# Patient Record
Sex: Male | Born: 1970 | Race: Black or African American | Hispanic: No | Marital: Single | Smoking: Light tobacco smoker
Health system: Southern US, Community
[De-identification: ages and names within clinical notes are randomized; demographics above are authoritative.]

## PROBLEM LIST (undated history)

## (undated) DIAGNOSIS — I82409 Acute embolism and thrombosis of unspecified deep veins of unspecified lower extremity: Secondary | ICD-10-CM

## (undated) DIAGNOSIS — Z5900 Homelessness unspecified: Secondary | ICD-10-CM

## (undated) DIAGNOSIS — F32A Depression, unspecified: Secondary | ICD-10-CM

## (undated) DIAGNOSIS — I2699 Other pulmonary embolism without acute cor pulmonale: Secondary | ICD-10-CM

## (undated) DIAGNOSIS — F319 Bipolar disorder, unspecified: Secondary | ICD-10-CM

## (undated) DIAGNOSIS — F329 Major depressive disorder, single episode, unspecified: Secondary | ICD-10-CM

## (undated) DIAGNOSIS — I803 Phlebitis and thrombophlebitis of lower extremities, unspecified: Secondary | ICD-10-CM

## (undated) HISTORY — PX: OTHER SURGICAL HISTORY: SHX169

## (undated) MED ORDER — WARFARIN 7.5 MG TAB
7.5 mg | ORAL_TABLET | Freq: Every evening | ORAL | Status: DC
Start: ? — End: 2012-09-12

## (undated) MED ORDER — WARFARIN 7.5 MG TAB
7.5 mg | ORAL_TABLET | Freq: Every day | ORAL | Status: DC
Start: ? — End: 2017-08-17

## (undated) MED ORDER — OMEPRAZOLE 20 MG TAB, DELAYED RELEASE
20 mg | ORAL_TABLET | Freq: Every day | ORAL | Status: AC
Start: ? — End: 2013-01-26

## (undated) MED ORDER — HYDROXYZINE 25 MG TAB
25 mg | ORAL_TABLET | Freq: Three times a day (TID) | ORAL | Status: AC | PRN
Start: ? — End: 2012-09-18

## (undated) MED ORDER — WARFARIN 7.5 MG TAB
7.5 mg | ORAL_TABLET | Freq: Every day | ORAL | Status: DC
Start: ? — End: 2012-09-12

## (undated) MED ORDER — KETOROLAC TROMETHAMINE 10 MG TAB
10 mg | ORAL_TABLET | Freq: Four times a day (QID) | ORAL | Status: DC | PRN
Start: ? — End: 2012-09-12

## (undated) MED ORDER — NAPROXEN SODIUM 550 MG TAB
550 mg | ORAL_TABLET | Freq: Three times a day (TID) | ORAL | Status: DC
Start: ? — End: 2012-08-03

## (undated) MED ORDER — CITALOPRAM 10 MG TAB
10 mg | ORAL_TABLET | Freq: Every day | ORAL | Status: DC
Start: ? — End: 2017-08-17

## (undated) MED ORDER — CITALOPRAM 10 MG TAB
10 mg | ORAL_TABLET | Freq: Every day | ORAL | Status: DC
Start: ? — End: 2012-09-12

## (undated) MED ORDER — CEPHALEXIN 500 MG CAP
500 mg | ORAL_CAPSULE | Freq: Two times a day (BID) | ORAL | Status: AC
Start: ? — End: 2013-01-03

---

## 2011-12-21 MED ORDER — IBUPROFEN 800 MG TAB
800 mg | ORAL_TABLET | Freq: Four times a day (QID) | ORAL | Status: AC | PRN
Start: 2011-12-21 — End: 2011-12-28

## 2011-12-21 NOTE — ED Notes (Signed)
Pt has swelling and tingling to right hand. 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

## 2011-12-21 NOTE — ED Notes (Signed)
I have reviewed discharge instructions with the patient. Prescription was given.  The patient verbalized understanding. Pt discharged via ambulatory . Pt in no distress at time of discharge.

## 2011-12-21 NOTE — ED Provider Notes (Signed)
HPI Comments: Howard Harris is a 41 yo male who c/o R hand pain that has been ongoing for 2 weeks. Denies any known injury. Has had numbness/tingling. 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 Has swelling to R hand. Denies other symptoms. Howard Harris in NAD in room.      pmhx - none  shx - none    Patient is a 42 y.o. male presenting with hand pain. The history is provided by the patient.   Hand Pain   This is a new problem. The current episode started more than 1 week ago. The problem occurs constantly. The problem has been gradually worsening. The pain is present in the right hand. The pain is at a severity of 10/10. Associated symptoms include numbness, limited range of motion and tingling. Pertinent negatives include no stiffness.        History reviewed. No pertinent past medical history.     History reviewed. No pertinent past surgical history.      History reviewed. No pertinent family history.     History     Social History   ??? Marital Status: MARRIED     Spouse Name: N/A     Number of Children: N/A   ??? Years of Education: N/A     Occupational History   ??? Not on file.     Social History Main Topics   ??? Smoking status: Not on file   ??? Smokeless tobacco: Not on file   ??? Alcohol Use: Not on file   ??? Drug Use: Not on file   ??? Sexually Active: Not on file     Other Topics Concern   ??? Not on file     Social History Narrative   ??? No narrative on file                  ALLERGIES: Review of patient's allergies indicates no known allergies.      Review of Systems   Constitutional: Negative.  Negative for fever, diaphoresis and unexpected weight change.   Respiratory: Negative for cough and shortness of breath.    Cardiovascular: Negative for chest pain and palpitations.   Musculoskeletal: Positive for joint swelling and arthralgias. Negative for stiffness.   Skin: Negative for color change and pallor.   Neurological: Positive for tingling and numbness.    Psychiatric/Behavioral: Negative.  Negative for behavioral problems, confusion, self-injury and agitation.   All other systems reviewed and are negative.        Filed Vitals:    12/21/11 1246   BP: 110/90   Pulse: 70   Temp: 98.6 ??F (37 ??C)   Resp: 14   Height: 6\' 1"  (1.854 m)   Weight: 79.379 kg (175 lb)   SpO2: 98%            Physical Exam   Nursing note and vitals reviewed.  Constitutional: He is oriented to person, place, and time. He appears well-developed and well-nourished.  Non-toxic appearance. He does not have a sickly appearance. He does not appear ill. No distress.   HENT:   Head: Normocephalic and atraumatic.   Eyes: Conjunctivae and EOM are normal. Pupils are equal, round, and reactive to light.   Neck: Normal range of motion. Neck supple.   Cardiovascular: Normal rate, regular rhythm and normal heart sounds.  PMI is not displaced.  Exam reveals no gallop.    No murmur heard.  Pulmonary/Chest: Effort normal and breath sounds normal. No respiratory distress. He has no decreased breath sounds. He has no wheezes. He has no rhonchi. He has no rales.   Abdominal: Soft. There is no tenderness.   Musculoskeletal: He exhibits no edema and no tenderness.        Right hand: He exhibits tenderness and swelling. He exhibits normal range of motion, normal two-point discrimination, normal capillary refill, no deformity and no laceration. normal sensation noted. Normal strength noted.        Hands:  Neurological: He is alert and oriented to person, place, and time.   Skin: Skin is warm and dry. He is not diaphoretic.   Psychiatric: He has a normal mood and affect. His behavior is normal. Judgment and thought content normal.        MDM    Procedures    Assessment: right hand pain, swelling. Carpal tunnel.    Plan: fu with pcp. RICE. NSAIDs. Return if s/sxs worsen or change.    I have discussed the results of labs, procedures, radiographs, treatments as well as any previous results found within the [facility]. [facility] with the patient and available family.?? A treatment plan was developed in conjunction with the patient and was agreed upon. The patient is ready for discharge at this time.?? All voiced understanding of the discharge plan and medication instructions or changes as appropriate.?? Questions about treatment in the ED were answered.?? The patient was encouraged to return should symptoms worsen or new problems develop. A follow up physician was provided to the patient on the discharge papers.

## 2011-12-21 NOTE — ED Notes (Signed)
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

## 2011-12-21 NOTE — ED Provider Notes (Signed)
I was personally available for consultation in the emergency department.  I have reviewed the chart and agree with the documentation recorded by the MLP, including the assessment, treatment plan, and disposition.  Cystal Shannahan J. Samuel Rittenhouse, DO

## 2012-05-11 LAB — METABOLIC PANEL, COMPREHENSIVE
A-G Ratio: 0.9 — ABNORMAL LOW (ref 1.2–3.5)
ALT (SGPT): 28 U/L (ref 12–65)
AST (SGOT): 21 U/L (ref 15–37)
Albumin: 3.2 g/dL — ABNORMAL LOW (ref 3.5–5.0)
Alk. phosphatase: 77 U/L (ref 50–136)
Anion gap: 7 mmol/L (ref 7–16)
BUN: 12 MG/DL (ref 6–23)
Bilirubin, total: 0.2 MG/DL (ref 0.2–1.1)
CO2: 29 MMOL/L (ref 21–32)
Calcium: 8.5 MG/DL (ref 8.3–10.4)
Chloride: 106 MMOL/L (ref 98–107)
Creatinine: 1.55 MG/DL — ABNORMAL HIGH (ref 0.8–1.5)
GFR est AA: >60 mL/min/{1.73_m2} (ref >60)
GFR est non-AA: 53 mL/min/{1.73_m2} — ABNORMAL LOW (ref >60)
Globulin: 3.6 g/dL — ABNORMAL HIGH (ref 2.3–3.5)
Glucose: 94 MG/DL (ref 65–100)
Potassium: 4.3 MMOL/L (ref 3.5–5.1)
Protein, total: 6.8 g/dL (ref 6.3–8.2)
Sodium: 142 MMOL/L (ref 136–145)

## 2012-05-11 LAB — CBC WITH AUTOMATED DIFF
ABS. BASOPHILS: 0 10*3/uL (ref 0.0–0.2)
ABS. EOSINOPHILS: 0.3 10*3/uL (ref 0.0–0.8)
ABS. IMM. GRANS.: 0 10*3/uL (ref 0.0–0.5)
ABS. LYMPHOCYTES: 2.9 10*3/uL (ref 0.5–4.6)
ABS. MONOCYTES: 1.3 10*3/uL (ref 0.1–1.3)
ABS. NEUTROPHILS: 6 10*3/uL (ref 1.7–8.2)
BASOPHILS: 0 % (ref 0.0–2.0)
EOSINOPHILS: 3 % (ref 0.5–7.8)
HCT: 40.2 % — ABNORMAL LOW (ref 41.1–50.3)
HGB: 13.5 g/dL — ABNORMAL LOW (ref 13.6–17.2)
IMMATURE GRANULOCYTES: 0.4 % (ref 0.0–5.0)
LYMPHOCYTES: 28 % (ref 13–44)
MCH: 30.7 PG (ref 26.1–32.9)
MCHC: 33.6 g/dL (ref 31.4–35.0)
MCV: 91.4 FL (ref 79.6–97.8)
MONOCYTES: 12 % (ref 4.0–12.0)
MPV: 9.2 FL — ABNORMAL LOW (ref 10.8–14.1)
NEUTROPHILS: 57 % (ref 43–78)
PLATELET: 191 10*3/uL (ref 150–450)
RBC: 4.4 M/uL (ref 4.23–5.67)
RDW: 13.8 % (ref 11.9–14.6)
WBC: 10.5 10*3/uL (ref 4.3–11.1)

## 2012-05-11 LAB — POC TROPONIN: Troponin-I (POC): 0 ng/ml (ref 0.0–0.08)

## 2012-05-11 NOTE — ED Notes (Signed)
Bedside report received. Pt sitting on stretcher no distress noted.

## 2012-05-11 NOTE — ED Notes (Signed)
Pt st he started having chest pain after back pain. Pt st back pain started approx 2 days ago. Pt st chest pain radiated from back to the left side of his ribs. Pt st he is now having chest pain that is in the middle of his chest. Pt st unable to take a deep breath due to sharp pain on left side. Pt denies nausea and vomiting. Pt st nonproductive cough

## 2012-05-11 NOTE — ED Notes (Signed)
Pt c/o chest pain and upper back pain with cough. Pt has sharp pains to the lower left chest. Pt states that cough is non productive.

## 2012-05-11 NOTE — ED Notes (Signed)
I have reviewed discharge instructions with the patient.  The patient verbalized understanding.

## 2012-05-11 NOTE — ED Provider Notes (Signed)
HPI Comments: Howard Harris is a 41 y.o. African American male in NAD presents with c/o L sided back pain onset 2 days ago that radiates to his L chest. Pt began to have a cough as well. No SOB and no fever or chills..      Patient is a 41 y.o. male presenting with chest pain. The history is provided by the patient.   Chest Pain (Angina)   The current episode started 2 days ago. The problem has not changed since onset.The problem occurs constantly. The pain is associated with normal activity. The pain is present in the left side and lateral region. The pain is at a severity of 3/10. The quality of the pain is described as sharp and stabbing. The pain radiates to the upper back. The symptoms are aggravated by movement and deep breathing. Associated symptoms include back pain and cough. Pertinent negatives include no abdominal pain, no claudication, no diaphoresis, no dizziness, no exertional chest pressure, no fever, no headaches, no hemoptysis, no irregular heartbeat, no leg pain, no lower extremity edema, no malaise/fatigue, no nausea, no near-syncope, no numbness, no orthopnea, no palpitations, no PND, no shortness of breath, no sputum production, no vomiting and no weakness. He has tried nothing for the symptoms. Risk factors include male gender.        History reviewed. No pertinent past medical history.     History reviewed. No pertinent past surgical history.      History reviewed. No pertinent family history.     History     Social History   ??? Marital Status: MARRIED     Spouse Name: N/A     Number of Children: N/A   ??? Years of Education: N/A     Occupational History   ??? Not on file.     Social History Main Topics   ??? Smoking status: Current Some Day Smoker   ??? Smokeless tobacco: Not on file   ??? Alcohol Use: Yes      occ   ??? Drug Use: Yes     Special: Marijuana   ??? Sexually Active:      Other Topics Concern   ??? Not on file     Social History Narrative   ??? No narrative on file                  ALLERGIES:  Review of patient's allergies indicates no known allergies.      Review of Systems   Constitutional: Negative for fever, chills, malaise/fatigue and diaphoresis.   HENT: Negative for neck pain and neck stiffness.    Respiratory: Positive for cough. Negative for hemoptysis, sputum production and shortness of breath.    Cardiovascular: Positive for chest pain. Negative for palpitations, orthopnea, claudication, PND and near-syncope.   Gastrointestinal: Negative for nausea, vomiting and abdominal pain.   Genitourinary: Negative for dysuria, frequency and flank pain.   Musculoskeletal: Positive for back pain.   Neurological: Negative for dizziness, weakness, numbness and headaches.   All other systems reviewed and are negative.        Filed Vitals:    05/11/12 1800   BP: 121/73   Pulse: 86   Temp: 98.6 ??F (37 ??C)   Resp: 20   Height: 6\' 1"  (1.854 m)   Weight: 68.947 kg (152 lb)   SpO2: 99%            Physical Exam   Nursing note and vitals reviewed.  Constitutional: He is oriented  to person, place, and time. Vital signs are normal. He appears well-developed and well-nourished. He is cooperative.  Non-toxic appearance. He does not have a sickly appearance. He does not appear ill. No distress.   HENT:   Head: Normocephalic and atraumatic.   Right Ear: External ear normal.   Left Ear: External ear normal.   Nose: Nose normal.   Mouth/Throat: Oropharynx is clear and moist.   Eyes: Conjunctivae and EOM are normal. No scleral icterus.   Neck: Normal range of motion and full passive range of motion without pain. Neck supple.   Cardiovascular: Normal rate, regular rhythm, intact distal pulses and normal pulses.    Pulmonary/Chest: Effort normal and breath sounds normal. No respiratory distress. He has no decreased breath sounds. He has no wheezes. He has no rhonchi. He has no rales.   Abdominal: Soft. Normal appearance.   Musculoskeletal: Normal range of motion. He exhibits no tenderness.        Thoracic back: He exhibits  tenderness.        Back:    Neurological: He is alert and oriented to person, place, and time. He has normal strength. No sensory deficit.   Skin: Skin is warm, dry and intact. No rash noted. He is not diaphoretic. No pallor.   Psychiatric: He has a normal mood and affect. His speech is normal and behavior is normal. Judgment and thought content normal. Cognition and memory are normal.        MDM    Procedures    Recent Results (from the past 24 hour(s))   CBC WITH AUTOMATED DIFF    Collection Time    05/11/12  6:05 PM       Component Value Range    WBC 10.5  4.3 - 11.1 K/uL    RBC 4.40  4.23 - 5.67 M/uL    HGB 13.5 (*) 13.6 - 17.2 Esther Broyles/dL    HCT 21.3 (*) 08.6 - 50.3 %    MCV 91.4  79.6 - 97.8 FL    MCH 30.7  26.1 - 32.9 PG    MCHC 33.6  31.4 - 35.0 Delta Deshmukh/dL    RDW 57.8  46.9 - 62.9 %    PLATELET 191  150 - 450 K/uL    MPV 9.2 (*) 10.8 - 14.1 FL    DF AUTOMATED      NEUTROPHILS 57  43 - 78 %    LYMPHOCYTES 28  13 - 44 %    MONOCYTES 12  4.0 - 12.0 %    EOSINOPHILS 3  0.5 - 7.8 %    BASOPHILS 0  0.0 - 2.0 %    IMMATURE GRANULOCYTES 0.4  0.0 - 5.0 %    ABS. NEUTROPHILS 6.0  1.7 - 8.2 K/UL    ABS. LYMPHOCYTES 2.9  0.5 - 4.6 K/UL    ABS. MONOCYTES 1.3  0.1 - 1.3 K/UL    ABS. EOSINOPHILS 0.3  0.0 - 0.8 K/UL    ABS. BASOPHILS 0.0  0.0 - 0.2 K/UL    ABS. IMM. GRANS. 0.0  0.0 - 0.5 K/UL   METABOLIC PANEL, COMPREHENSIVE    Collection Time    05/11/12  6:05 PM       Component Value Range    Sodium 142  136 - 145 MMOL/L    Potassium 4.3  3.5 - 5.1 MMOL/L    Chloride 106  98 - 107 MMOL/L    CO2 29  21 - 32 MMOL/L  Anion gap 7  7 - 16 mmol/L    Glucose 94  65 - 100 MG/DL    BUN 12  6 - 23 MG/DL    Creatinine 0.98 (*) 0.8 - 1.5 MG/DL    GFR est AA >11  >91 ml/min/1.66m2    GFR est non-AA 53 (*) >60 ml/min/1.6m2    Calcium 8.5  8.3 - 10.4 MG/DL    Bilirubin, total 0.2  0.2 - 1.1 MG/DL    ALT 28  12 - 65 U/L    AST 21  15 - 37 U/L    Alk. phosphatase 77  50 - 136 U/L    Protein, total 6.8  6.3 - 8.2 Serenna Deroy/dL    Albumin 3.2 (*) 3.5 - 5.0 Vinod Mikesell/dL     Globulin 3.6 (*) 2.3 - 3.5 Rosmarie Esquibel/dL    A-Anaid Haney Ratio 0.9 (*) 1.2 - 3.5     POC TROPONIN-I    Collection Time    05/11/12  6:06 PM       Component Value Range    Troponin-I (POC) 0  0.0 - 0.08 ng/ml      CXR is neg  EKG is neg for acute changes    Assessment: Intercostal Neuritis    Plan: Anaprox DS

## 2012-05-12 LAB — EKG, 12 LEAD, INITIAL
Atrial Rate: 91 {beats}/min
Calculated P Axis: 75 degrees
Calculated R Axis: 74 degrees
Calculated T Axis: 61 degrees
Diagnosis: NORMAL
P-R Interval: 164 ms
Q-T Interval: 334 ms
QRS Duration: 80 ms
QTC Calculation (Bezet): 410 ms
Ventricular Rate: 91 {beats}/min

## 2012-05-12 LAB — URINE MICROSCOPIC: Bacteria: 0 /HPF

## 2012-08-03 ENCOUNTER — Inpatient Hospital Stay
Admit: 2012-08-03 | Discharge: 2012-08-13 | Disposition: A | Discharge status: Home / Self Care | Payer: Self-pay | Attending: Internal Medicine | Admitting: Internal Medicine

## 2012-08-03 DIAGNOSIS — I82409 Acute embolism and thrombosis of unspecified deep veins of unspecified lower extremity: Secondary | ICD-10-CM

## 2012-08-03 LAB — METABOLIC PANEL, BASIC
Anion gap: 9 mmol/L (ref 7–16)
BUN: 13 MG/DL (ref 6–23)
CO2: 25 MMOL/L (ref 21–32)
Calcium: 9 MG/DL (ref 8.3–10.4)
Chloride: 102 MMOL/L (ref 98–107)
Creatinine: 1.05 MG/DL (ref 0.8–1.5)
GFR est AA: >60 mL/min/{1.73_m2} (ref >60)
GFR est non-AA: >60 mL/min/{1.73_m2} (ref >60)
Glucose: 102 MG/DL — ABNORMAL HIGH (ref 65–100)
Potassium: 4.4 MMOL/L (ref 3.5–5.1)
Sodium: 136 MMOL/L (ref 136–145)

## 2012-08-03 LAB — URINE MICROSCOPIC: Bacteria: 0 /HPF

## 2012-08-03 LAB — PTT: aPTT: 34.2 s — ABNORMAL HIGH (ref 23.5–31.7)

## 2012-08-03 LAB — CBC W/O DIFF
HCT: 39.5 % — ABNORMAL LOW (ref 41.1–50.3)
HGB: 13.3 g/dL — ABNORMAL LOW (ref 13.6–17.2)
MCH: 29.5 PG (ref 26.1–32.9)
MCHC: 33.7 g/dL (ref 31.4–35.0)
MCV: 87.6 FL (ref 79.6–97.8)
MPV: 9.6 FL — ABNORMAL LOW (ref 10.8–14.1)
PLATELET: 212 10*3/uL (ref 150–450)
RBC: 4.51 M/uL (ref 4.23–5.67)
RDW: 14.6 % (ref 11.9–14.6)
WBC: 14.8 10*3/uL — ABNORMAL HIGH (ref 4.3–11.1)

## 2012-08-03 LAB — PROTHROMBIN TIME + INR
INR: 1 (ref 0.9–1.2)
Prothrombin time: 11.1 s (ref 9.6–11.6)

## 2012-08-03 MED ORDER — ONDANSETRON (PF) 4 MG/2 ML INJECTION
4 mg/2 mL | INTRAMUSCULAR | Status: DC | PRN
Start: 2012-08-03 — End: 2012-08-13
  Administered 2012-08-04 – 2012-08-07 (×6): via INTRAVENOUS

## 2012-08-03 MED ADMIN — enoxaparin (LOVENOX) injection 70 mg: SUBCUTANEOUS | @ 23:00:00 | NDC 00075062280

## 2012-08-03 MED ADMIN — HYDROmorphone (PF) (DILAUDID) injection 1 mg: INTRAVENOUS | @ 23:00:00 | NDC 00409128331

## 2012-08-03 MED ADMIN — ondansetron (ZOFRAN) injection 4 mg: INTRAVENOUS | @ 23:00:00 | NDC 00641607801

## 2012-08-03 MED FILL — LOVENOX 80 MG/0.8 ML SUBCUTANEOUS SYRINGE: 80 mg/0.8 mL | SUBCUTANEOUS | Qty: 0.8

## 2012-08-03 MED FILL — HYDROMORPHONE (PF) 1 MG/ML IJ SOLN: 1 mg/mL | INTRAMUSCULAR | Qty: 1

## 2012-08-03 MED FILL — ONDANSETRON (PF) 4 MG/2 ML INJECTION: 4 mg/2 mL | INTRAMUSCULAR | Qty: 2

## 2012-08-03 MED FILL — SODIUM CHLORIDE 0.9 % IV: INTRAVENOUS | Qty: 1000

## 2012-08-03 NOTE — Progress Notes (Signed)
Skin assessment with Howard Harris CC RN  Patient has no skin breakdown, no rashes, no erythema.

## 2012-08-03 NOTE — ED Provider Notes (Signed)
HPI Comments: Howard Harris was laying on the couch when he started having pain in the right thigh.  + hamstring injury in September and tibia fracture in high school.    Patient is a 41 y.o. male presenting with leg pain. The history is provided by the patient.   Leg Pain   This is a new problem. The current episode started yesterday. The problem occurs constantly. The problem has been gradually worsening. The pain is present in the right upper leg. The pain is at a severity of 10/10. The pain is moderate. Pertinent negatives include no numbness, full range of motion, no stiffness, no tingling, no itching, no back pain and no neck pain. Associated symptoms comments: No SOB. The symptoms are aggravated by standing and activity. He has tried nothing for the symptoms. There has been no history of extremity trauma.        History reviewed. No pertinent past medical history.     History reviewed. No pertinent past surgical history.      History reviewed. No pertinent family history.     History     Social History   ??? Marital Status: MARRIED     Spouse Name: N/A     Number of Children: N/A   ??? Years of Education: N/A     Occupational History   ??? Not on file.     Social History Main Topics   ??? Smoking status: Former Smoker     Quit date: 11/07/2011   ??? Smokeless tobacco: Never Used   ??? Alcohol Use: No   ??? Drug Use: No   ??? Sexually Active: Not on file     Other Topics Concern   ??? Not on file     Social History Narrative   ??? No narrative on file                  ALLERGIES: Review of patient's allergies indicates no known allergies.      Review of Systems   Constitutional: Negative for chills and diaphoresis.   HENT: Negative for hearing loss and neck pain.    Eyes: Negative for pain and discharge.   Respiratory: Negative for cough, chest tightness and shortness of breath.    Cardiovascular: Positive for leg swelling. Negative for chest pain.   Gastrointestinal: Negative for abdominal pain and blood in stool.   Genitourinary: Negative  for enuresis and difficulty urinating.   Musculoskeletal: Negative for back pain, gait problem and stiffness.   Skin: Negative for color change, itching and pallor.   Allergic/Immunologic: Negative for environmental allergies and food allergies.   Neurological: Negative for dizziness, tingling and numbness.   Hematological: Negative for adenopathy. Does not bruise/bleed easily.   Psychiatric/Behavioral: Negative for behavioral problems and agitation.   All other systems reviewed and are negative.        Filed Vitals:    08/03/12 1308   BP: 127/78   Pulse: 110   Temp: 99.1 ??F (37.3 ??C)   Resp: 14   Height: 6' (1.829 m)   Weight: 72.576 kg (160 lb)   SpO2: 100%            Physical Exam   Nursing note and vitals reviewed.  Constitutional: He is oriented to person, place, and time. He appears well-developed and well-nourished. No distress.   HENT:   Head: Normocephalic and atraumatic.   Eyes: EOM are normal. Pupils are equal, round, and reactive to light.   Neck: Normal range of motion.  Cardiovascular: Normal rate, regular rhythm, normal heart sounds and intact distal pulses.    Pulmonary/Chest: Effort normal and breath sounds normal. No respiratory distress.   Abdominal: Soft. Bowel sounds are normal. He exhibits no distension. There is no tenderness.   Genitourinary: Penis normal.   Musculoskeletal: Normal range of motion. He exhibits tenderness.   Right groin tenderness.  No popliteal tenderness.  FROM.  Limb warm.   Neurological: He is alert and oriented to person, place, and time.   Skin: Skin is warm. No rash noted.   Psychiatric: He has a normal mood and affect. His behavior is normal.        MDM     Differential Diagnosis; Clinical Impression; Plan:     Discussed case with Dr. Kennedy Bucker who will see the patient in the ed.  Amount and/or Complexity of Data Reviewed:   Clinical lab tests:  Reviewed  Tests in the radiology section of CPT??:  Reviewed  Critical Care:   Total time providing critical care:  30-74  minutes (Excluding all other billable procedures.)      Procedures

## 2012-08-03 NOTE — ED Notes (Signed)
Pt is back from US.

## 2012-08-03 NOTE — ED Notes (Signed)
TRANSFER - OUT REPORT:    Verbal report given to Ale, RN on [employer]  being transferred to 703 for routine progression of care       Report consisted of patient???s Situation, Background, Assessment and   Recommendations(SBAR).     Information from the following report(s) ED Summary was reviewed with the receiving nurse.    Opportunity for questions and clarification was provided.

## 2012-08-03 NOTE — Progress Notes (Signed)
TRANSFER - IN REPORT:    Verbal report received froJulianne A Head, RN  m (name) on Howard Harris  being received from ED(unit) for routine progression of care      Report consisted of patient???s Situation, Background, Assessment and   Recommendations(SBAR).     Information from the following report(s) SBAR was reviewed with the receiving nurse.    Opportunity for questions and clarification was provided.      Assessment completed upon patient???s arrival to unit and care assumed.

## 2012-08-03 NOTE — ED Notes (Signed)
Pt reports upper right leg pain, was stretching and began having pain in that leg.

## 2012-08-03 NOTE — H&P (Addendum)
History and Physical    Subjective:     Howard Harris is a 41 y.o. black male w/ PMH of HTN presents with leg pain.  Pt reportedly was stretching approx a week ago and felt like he injured the back of his knee.  Since that time he has had decreased mobility and noticed that the pain was extending from back of right knee up into thigh area.  He describes the pain as excruciating throughout entire thigh.  He denies any shortness of breath, fever, chills, cp, n/v/diarrhea/ or urinary symptoms.  No discharge.        Past Medical History   Diagnosis Date   ??? Hypertension       History reviewed. No pertinent past surgical history.  Family History   Problem Relation Age of Onset   ??? Hypertension        History   Substance Use Topics   ??? Smoking status: Former Smoker     Quit date: 11/07/2011   ??? Smokeless tobacco: Never Used   ??? Alcohol Use: No       Prior to Admission medications    Not on File     No Known Allergies     Review of Systems:  A comprehensive review of systems was negative except for that written in the History of Present Illness.     Objective:     Intake and Output:            Physical Exam:   BP 127/78   Pulse 110   Temp(Src) 99.1 ??F (37.3 ??C)   Resp 14   Ht 6' (1.829 m)   Wt 72.576 kg (160 lb)   BMI 21.7 kg/m2   SpO2 100%  Eyes: conjunctivae/corneas clear. PERRL, EOM's intact.   Lungs: clear to auscultation bilaterally  Heart: regular rate and rhythm, S1, S2 normal, no murmur, click, rub or gallop  Abdomen: soft, non-tender. Bowel sounds normal. No masses,  no organomegaly  Extremities:rle nl; edema from right knee to thigh.  Tenderness on palpation.    Skin: Skin color, texture, turgor normal. No rashes or lesions  Lymph nodes: Cervical, supraclavicular, and axillary nodes normal.  Neurologic: Grossly normal      Data Review:   Recent Results (from the past 24 hour(s))   URINE MICROSCOPIC    Collection Time    08/03/12  2:50 PM       Result Value Range    WBC 5-10  0 /HPF    RBC 5-10  0 /HPF     Epithelial cells 0-3  0 /HPF    Bacteria 0  0 /HPF    Casts 5-10 HYALINE  0 /LPF       Chest x-ray was negative for infiltrate, effusion, pneumothorax, or wide mediastinum.    Assessment:     Principal Problem:    DVT (deep venous thrombosis) (08/03/2012)-  Extensive DVT of right popliteal to femoral and question whether ileal vein involved. Start Lovenox.  Had long conversation with Dr. Edwyna Shell, Interventional radiologist, and he would recommend thrombolysis for patient.  Patient would have to agree given he has no insurance.  Will ask social work to assist and left message for social work on weekend.  If he does not agree could be considered for lovenox/coumadin bridge vs. xarelto but very young patient.      Also would need coagulation w/u in future.      Active Problems:    Abnormal heart rate (08/03/2012)  Low grade fever- 100.  If spikes fever, need to consider septic phlebitis.          Plan:         Signed By: Alroy Bailiff, MD     August 03, 2012

## 2012-08-03 NOTE — ED Notes (Signed)
Pt taken to US via wheelchair

## 2012-08-03 NOTE — ED Notes (Signed)
Pt states having an old injury in the past of the right leg.  Pt walked in with crutches.

## 2012-08-03 NOTE — ED Notes (Signed)
Report called and pt ready for transport to room 703. Transport notified.

## 2012-08-03 NOTE — ED Notes (Signed)
Report received from A Fisher, RN, pt not presently in this room due to another occupant for a quick procedure, pt will be placed to this room shortly.

## 2012-08-04 LAB — METABOLIC PANEL, BASIC
Anion gap: 6 mmol/L — ABNORMAL LOW (ref 7–16)
BUN: 11 MG/DL (ref 6–23)
CO2: 27 MMOL/L (ref 21–32)
Calcium: 8.3 MG/DL (ref 8.3–10.4)
Chloride: 101 MMOL/L (ref 98–107)
Creatinine: 1.11 MG/DL (ref 0.8–1.5)
GFR est AA: >60 mL/min/{1.73_m2} (ref >60)
GFR est non-AA: >60 mL/min/{1.73_m2} (ref >60)
Glucose: 123 MG/DL — ABNORMAL HIGH (ref 65–100)
Potassium: 4 MMOL/L (ref 3.5–5.1)
Sodium: 134 MMOL/L — ABNORMAL LOW (ref 136–145)

## 2012-08-04 LAB — CBC WITH AUTOMATED DIFF
ABS. BASOPHILS: 0 10*3/uL (ref 0.0–0.2)
ABS. EOSINOPHILS: 0.3 10*3/uL (ref 0.0–0.8)
ABS. IMM. GRANS.: 0 10*3/uL (ref 0.0–0.5)
ABS. LYMPHOCYTES: 1.4 10*3/uL (ref 0.5–4.6)
ABS. MONOCYTES: 1.8 10*3/uL — ABNORMAL HIGH (ref 0.1–1.3)
ABS. NEUTROPHILS: 8.1 10*3/uL (ref 1.7–8.2)
BASOPHILS: 0 % (ref 0.0–2.0)
EOSINOPHILS: 3 % (ref 0.5–7.8)
HCT: 34.2 % — ABNORMAL LOW (ref 41.1–50.3)
HGB: 11.6 g/dL — ABNORMAL LOW (ref 13.6–17.2)
IMMATURE GRANULOCYTES: 0.3 % (ref 0.0–5.0)
LYMPHOCYTES: 12 % — ABNORMAL LOW (ref 13–44)
MCH: 29.5 PG (ref 26.1–32.9)
MCHC: 33.9 g/dL (ref 31.4–35.0)
MCV: 87 FL (ref 79.6–97.8)
MONOCYTES: 16 % — ABNORMAL HIGH (ref 4.0–12.0)
MPV: 9.7 FL — ABNORMAL LOW (ref 10.8–14.1)
NEUTROPHILS: 69 % (ref 43–78)
PLATELET: 196 10*3/uL (ref 150–450)
RBC: 3.93 M/uL — ABNORMAL LOW (ref 4.23–5.67)
RDW: 14.4 % (ref 11.9–14.6)
WBC: 11.7 10*3/uL — ABNORMAL HIGH (ref 4.3–11.1)

## 2012-08-04 MED ADMIN — oxyCODONE-acetaminophen (PERCOCET) 5-325 mg per tablet 1 Tab: ORAL | @ 17:00:00 | NDC 00406051223

## 2012-08-04 MED ADMIN — sodium chloride (NS) flush 5-10 mL: INTRAVENOUS | @ 01:00:00 | NDC 87701099893

## 2012-08-04 MED ADMIN — oxyCODONE-acetaminophen (PERCOCET) 5-325 mg per tablet 1 Tab: ORAL | @ 11:00:00 | NDC 00406051223

## 2012-08-04 MED ADMIN — 0.9% sodium chloride infusion: INTRAVENOUS | @ 13:00:00 | NDC 00409798309

## 2012-08-04 MED ADMIN — sodium chloride (NS) flush 5-10 mL: INTRAVENOUS | @ 21:00:00 | NDC 87701099893

## 2012-08-04 MED ADMIN — oxyCODONE-acetaminophen (PERCOCET) 5-325 mg per tablet 1 Tab: ORAL | @ 21:00:00 | NDC 00406051223

## 2012-08-04 MED ADMIN — oxyCODONE-acetaminophen (PERCOCET) 5-325 mg per tablet 1 Tab: ORAL | @ 08:00:00 | NDC 00406051223

## 2012-08-04 MED ADMIN — oxyCODONE-acetaminophen (PERCOCET) 5-325 mg per tablet 1 Tab: ORAL | @ 03:00:00 | NDC 00406051223

## 2012-08-04 MED ADMIN — enoxaparin (LOVENOX) injection 70 mg: SUBCUTANEOUS | NDC 00075801801

## 2012-08-04 MED ADMIN — sodium chloride (NS) flush 5-10 mL: INTRAVENOUS | @ 03:00:00 | NDC 87701099893

## 2012-08-04 MED ADMIN — 0.9% sodium chloride infusion: INTRAVENOUS | @ 01:00:00 | NDC 00409798309

## 2012-08-04 MED ADMIN — sodium chloride (NS) flush 5-10 mL: INTRAVENOUS | @ 11:00:00 | NDC 87701099893

## 2012-08-04 MED ADMIN — 0.9% sodium chloride infusion: INTRAVENOUS | @ 11:00:00 | NDC 00409798309

## 2012-08-04 MED ADMIN — enoxaparin (LOVENOX) injection 70 mg: SUBCUTANEOUS | @ 11:00:00 | NDC 00075801801

## 2012-08-04 MED FILL — LOVENOX 80 MG/0.8 ML SUBCUTANEOUS SYRINGE: 80 mg/0.8 mL | SUBCUTANEOUS | Qty: 0.8

## 2012-08-04 MED FILL — OXYCODONE-ACETAMINOPHEN 5 MG-325 MG TAB: 5-325 mg | ORAL | Qty: 1

## 2012-08-04 MED FILL — ONDANSETRON (PF) 4 MG/2 ML INJECTION: 4 mg/2 mL | INTRAMUSCULAR | Qty: 2

## 2012-08-04 NOTE — Progress Notes (Signed)
END OF SHIFT NOTE:    INTAKE/OUTPUT       Flatus: Patient does have flatus present.    Stool:  0 occurrences.    Characteristics:       Emesis: 0 occurrences.    Characteristics:          VITAL SIGNS  Patient Vitals for the past 12 hrs:   Temp Pulse Resp BP SpO2   08/04/12 1622 - - 18 - 99 %   08/04/12 1531 98.3 ??F (36.8 ??C) 87 18 117/76 mmHg 99 %   08/04/12 1202 - - 18 - 100 %   08/04/12 1113 99 ??F (37.2 ??C) 78 18 125/64 mmHg 100 %   08/04/12 0728 99.6 ??F (37.6 ??C) 81 18 115/65 mmHg 99 %       Pain Assessment  Pain Intensity 1: 6 (08/04/12 1622)  Pain Location 1: Groin  Pain Intervention(s) 1: Medication (see MAR)  Patient Stated Pain Goal: 0      Shift report given to oncoming nurse at the bedside.    Chauncey Fischer, RN

## 2012-08-04 NOTE — Progress Notes (Signed)
Hospitalist Progress Note    Subjective:   Daily Progress Note: 08/04/2012 9:05 AM    Patient seen and examined.  Chart and RN notes reviewed  Still with some pain in upper thigh/groin area, but much improved compared to admission  We again discussed [facility] thrombolysis and he and mother are undecided. He has no insurance/pharmacy plan, so xarelto not an option    Current Facility-Administered Medications   Medication Dose Route Frequency   ??? [COMPLETED] enoxaparin (LOVENOX) injection 70 mg  70 mg SubCUTAneous NOW   ??? [COMPLETED] HYDROmorphone (PF) (DILAUDID) injection 1 mg  1 mg IntraVENous NOW   ??? [COMPLETED] ondansetron (ZOFRAN) injection 4 mg  4 mg IntraVENous NOW   ??? sodium chloride (NS) flush 5-10 mL  5-10 mL IntraVENous Q8H   ??? sodium chloride (NS) flush 5-10 mL  5-10 mL IntraVENous PRN   ??? 0.9% sodium chloride infusion  75 mL/hr IntraVENous CONTINUOUS   ??? oxyCODONE-acetaminophen (PERCOCET) 5-325 mg per tablet 1 Tab  1 Tab Oral Q4H PRN   ??? ondansetron (ZOFRAN) injection 4 mg  4 mg IntraVENous Q4H PRN   ??? enoxaparin (LOVENOX) injection 70 mg  70 mg SubCUTAneous Q12H        Review of Systems  Pertinent items are noted in HPI.    Objective:     BP 115/65   Pulse 81   Temp(Src) 99.6 ??F (37.6 ??C)   Resp 18   Ht 6' (1.829 m)   Wt 72.576 kg (160 lb)   BMI 21.7 kg/m2   SpO2 99%   O2 Device: Room air    Temp (24hrs), Avg:99.8 ??F (37.7 ??C), Min:99.1 ??F (37.3 ??C), Max:100.7 ??F (38.2 ??C)              BP 115/65   Pulse 81   Temp(Src) 99.6 ??F (37.6 ??C)   Resp 18   Ht 6' (1.829 m)   Wt 72.576 kg (160 lb)   BMI 21.7 kg/m2   SpO2 99%  General appearance: alert, cooperative, no distress, appears stated age  Lungs: clear to auscultation bilaterally  Heart: regular rate and rhythm, S1, S2 normal, no murmur, click, rub or gallop  Abdomen: soft, non-tender. Bowel sounds normal. No masses,  no organomegaly  Extremities: extremities normal, atraumatic, no cyanosis or edema    Additional comments:I reviewed the patient's new clinical  lab test results. labs reviewed    Data Review    Recent Results (from the past 24 hour(s))   URINE MICROSCOPIC    Collection Time    08/03/12  2:50 PM       Result Value Range    WBC 5-10  0 /HPF    RBC 5-10  0 /HPF    Epithelial cells 0-3  0 /HPF    Bacteria 0  0 /HPF    Casts 5-10 HYALINE  0 /LPF   CBC W/O DIFF    Collection Time    08/03/12  5:10 PM       Result Value Range    WBC 14.8 (*) 4.3 - 11.1 K/uL    RBC 4.51  4.23 - 5.67 M/uL    HGB 13.3 (*) 13.6 - 17.2 g/dL    HCT 16.1 (*) 09.6 - 50.3 %    MCV 87.6  79.6 - 97.8 FL    MCH 29.5  26.1 - 32.9 PG    MCHC 33.7  31.4 - 35.0 g/dL    RDW 04.5  40.9 - 81.1 %  PLATELET 212  150 - 450 K/uL    MPV 9.6 (*) 10.8 - 14.1 FL   METABOLIC PANEL, BASIC    Collection Time    08/03/12  5:10 PM       Result Value Range    Sodium 136  136 - 145 MMOL/L    Potassium 4.4  3.5 - 5.1 MMOL/L    Chloride 102  98 - 107 MMOL/L    CO2 25  21 - 32 MMOL/L    Anion gap 9  7 - 16 mmol/L    Glucose 102 (*) 65 - 100 MG/DL    BUN 13  6 - 23 MG/DL    Creatinine 9.14  0.8 - 1.5 MG/DL    GFR est AA >78  >29 ml/min/1.101m2    GFR est non-AA >60  >60 ml/min/1.26m2    Calcium 9.0  8.3 - 10.4 MG/DL   PROTHROMBIN TIME    Collection Time    08/03/12  5:10 PM       Result Value Range    Prothrombin time 11.1  9.6 - 11.6 sec    INR 1.0  0.9 - 1.2     PTT    Collection Time    08/03/12  5:10 PM       Result Value Range    aPTT 34.2 (*) 23.5 - 31.7 SEC   CBC WITH AUTOMATED DIFF    Collection Time    08/04/12  4:40 AM       Result Value Range    WBC 11.7 (*) 4.3 - 11.1 K/uL    RBC 3.93 (*) 4.23 - 5.67 M/uL    HGB 11.6 (*) 13.6 - 17.2 g/dL    HCT 56.2 (*) 13.0 - 50.3 %    MCV 87.0  79.6 - 97.8 FL    MCH 29.5  26.1 - 32.9 PG    MCHC 33.9  31.4 - 35.0 g/dL    RDW 86.5  78.4 - 69.6 %    PLATELET 196  150 - 450 K/uL    MPV 9.7 (*) 10.8 - 14.1 FL    DF AUTOMATED      NEUTROPHILS 69  43 - 78 %    LYMPHOCYTES 12 (*) 13 - 44 %    MONOCYTES 16 (*) 4.0 - 12.0 %    EOSINOPHILS 3  0.5 - 7.8 %    BASOPHILS 0  0.0 - 2.0  %    IMMATURE GRANULOCYTES 0.3  0.0 - 5.0 %    ABS. NEUTROPHILS 8.1  1.7 - 8.2 K/UL    ABS. LYMPHOCYTES 1.4  0.5 - 4.6 K/UL    ABS. MONOCYTES 1.8 (*) 0.1 - 1.3 K/UL    ABS. EOSINOPHILS 0.3  0.0 - 0.8 K/UL    ABS. BASOPHILS 0.0  0.0 - 0.2 K/UL    ABS. IMM. GRANS. 0.0  0.0 - 0.5 K/UL   METABOLIC PANEL, BASIC    Collection Time    08/04/12  4:40 AM       Result Value Range    Sodium 134 (*) 136 - 145 MMOL/L    Potassium 4.0  3.5 - 5.1 MMOL/L    Chloride 101  98 - 107 MMOL/L    CO2 27  21 - 32 MMOL/L    Anion gap 6 (*) 7 - 16 mmol/L    Glucose 123 (*) 65 - 100 MG/DL    BUN 11  6 - 23 MG/DL    Creatinine 2.95  0.8 - 1.5 MG/DL    GFR est AA >98  >11 ml/min/1.42m2    GFR est non-AA >60  >60 ml/min/1.15m2    Calcium 8.3  8.3 - 10.4 MG/DL         Assessment/Plan:     Principal Problem:    DVT (deep venous thrombosis) (08/03/2012)    Active Problems:    Abnormal heart rate (08/03/2012)      PLAN:  1) stop IVF  2) start coumadin tonight. He will follow up at [facility] (per family)  3) will ask again about IR procedure tomorrow      Care Plan discussed with: Patient/Family and Nurse    Total time spent with patient: 25 minutes.    [facility] Devona Konig, MD

## 2012-08-05 LAB — EKG, 12 LEAD, INITIAL
Atrial Rate: 92 {beats}/min
Calculated P Axis: 75 degrees
Calculated R Axis: 63 degrees
Calculated T Axis: 70 degrees
P-R Interval: 146 ms
Q-T Interval: 320 ms
QRS Duration: 82 ms
QTC Calculation (Bezet): 395 ms
Ventricular Rate: 92 {beats}/min

## 2012-08-05 MED ORDER — ZOLPIDEM 5 MG TAB
5 mg | Freq: Every evening | ORAL | Status: DC | PRN
Start: 2012-08-05 — End: 2012-08-13
  Administered 2012-08-06 – 2012-08-09 (×4): via ORAL

## 2012-08-05 MED ORDER — ENOXAPARIN 80 MG/0.8 ML SUB-Q SYRINGE
80 mg/0.8 mL | Freq: Two times a day (BID) | SUBCUTANEOUS | Status: AC
Start: 2012-08-05 — End: 2012-08-05
  Administered 2012-08-05: 23:00:00 via SUBCUTANEOUS

## 2012-08-05 MED ADMIN — oxyCODONE-acetaminophen (PERCOCET) 5-325 mg per tablet 1 Tab: ORAL | @ 14:00:00 | NDC 00406051223

## 2012-08-05 MED ADMIN — oxyCODONE-acetaminophen (PERCOCET) 5-325 mg per tablet 1 Tab: ORAL | @ 20:00:00 | NDC 00406051223

## 2012-08-05 MED ADMIN — oxyCODONE-acetaminophen (PERCOCET) 5-325 mg per tablet 1 Tab: ORAL | @ 02:00:00 | NDC 00406051223

## 2012-08-05 MED ADMIN — sodium chloride (NS) flush 5-10 mL: INTRAVENOUS | @ 19:00:00 | NDC 87701099893

## 2012-08-05 MED ADMIN — oxyCODONE-acetaminophen (PERCOCET) 5-325 mg per tablet 1 Tab: ORAL | @ 09:00:00 | NDC 00406051223

## 2012-08-05 MED ADMIN — sodium chloride (NS) flush 5-10 mL: INTRAVENOUS | @ 02:00:00 | NDC 87701099893

## 2012-08-05 MED ADMIN — senna-docusate (PERICOLACE) 8.6-50 mg per tablet 1 Tab: ORAL | @ 23:00:00 | NDC 00904564361

## 2012-08-05 MED ADMIN — enoxaparin (LOVENOX) injection 70 mg: SUBCUTANEOUS | @ 11:00:00 | NDC 00075801801

## 2012-08-05 MED ADMIN — sodium chloride (NS) flush 5-10 mL: INTRAVENOUS | @ 11:00:00 | NDC 87701099893

## 2012-08-05 MED ADMIN — sodium chloride (NS) flush 5-10 mL: INTRAVENOUS | @ 09:00:00 | NDC 87701099893

## 2012-08-05 MED FILL — LOVENOX 80 MG/0.8 ML SUBCUTANEOUS SYRINGE: 80 mg/0.8 mL | SUBCUTANEOUS | Qty: 0.8

## 2012-08-05 MED FILL — OXYCODONE-ACETAMINOPHEN 5 MG-325 MG TAB: 5-325 mg | ORAL | Qty: 1

## 2012-08-05 MED FILL — SENNA PLUS 8.6 MG-50 MG TABLET: ORAL | Qty: 1

## 2012-08-05 MED FILL — ONDANSETRON (PF) 4 MG/2 ML INJECTION: 4 mg/2 mL | INTRAMUSCULAR | Qty: 2

## 2012-08-05 NOTE — Progress Notes (Signed)
Hospitalist Progress Note    Subjective:   Daily Progress Note: 08/05/2012 1:16 PM    Patient seen and examined.  Chart and RN notes reviewed    No specific complaints  He wishes to proceed with IR thrombolysis    Current Facility-Administered Medications   Medication Dose Route Frequency   ??? sodium chloride (NS) flush 5-10 mL  5-10 mL IntraVENous Q8H   ??? sodium chloride (NS) flush 5-10 mL  5-10 mL IntraVENous PRN   ??? oxyCODONE-acetaminophen (PERCOCET) 5-325 mg per tablet 1 Tab  1 Tab Oral Q4H PRN   ??? ondansetron (ZOFRAN) injection 4 mg  4 mg IntraVENous Q4H PRN   ??? enoxaparin (LOVENOX) injection 70 mg  70 mg SubCUTAneous Q12H        Review of Systems  Pertinent items are noted in HPI.    Objective:     BP 111/68   Pulse 85   Temp(Src) 98.9 ??F (37.2 ??C)   Resp 20   Ht 6' (1.829 m)   Wt 72.576 kg (160 lb)   BMI 21.7 kg/m2   SpO2 99%   O2 Device: Room air    Temp (24hrs), Avg:98.8 ??F (37.1 ??C), Min:97.8 ??F (36.6 ??C), Max:99.6 ??F (37.6 ??C)              BP 111/68   Pulse 85   Temp(Src) 98.9 ??F (37.2 ??C)   Resp 20   Ht 6' (1.829 m)   Wt 72.576 kg (160 lb)   BMI 21.7 kg/m2   SpO2 99%  General appearance: alert, cooperative, no distress, appears stated age  Lungs: clear to auscultation bilaterally  Heart: regular rate and rhythm, S1, S2 normal, no murmur, click, rub or gallop  Abdomen: soft, non-tender. Bowel sounds normal. No masses,  no organomegaly  Extremities: extremities normal, atraumatic, no cyanosis or edema    Additional comments:I reviewed the patient's new clinical lab test results. labs reviewed    Data Review    No results found for this or any previous visit (from the past 24 hour(s)).      Assessment/Plan:     Principal Problem:    DVT (deep venous thrombosis) (08/03/2012)    Active Problems:    Abnormal heart rate (08/03/2012)      PLAN  1) discussed with patient and mother, and they wish to proceed with procedure  2) I have informed Dr. Christianne Dolin, who states to give him lovenox dose tonight, and then hold.  NPO after MN  3) meanwhile will order [employer] stocking for patient      Care Plan discussed with: Patient/Family and Nurse    Total time spent with patient: 20 minutes.    [facility] Devona Konig, MD

## 2012-08-06 LAB — CBC W/O DIFF
HCT: 35.1 % — ABNORMAL LOW (ref 41.1–50.3)
HGB: 11.9 g/dL — ABNORMAL LOW (ref 13.6–17.2)
MCH: 29.5 PG (ref 26.1–32.9)
MCHC: 33.9 g/dL (ref 31.4–35.0)
MCV: 87.1 FL (ref 79.6–97.8)
MPV: 9.2 FL — ABNORMAL LOW (ref 10.8–14.1)
PLATELET: 284 10*3/uL (ref 150–450)
RBC: 4.03 M/uL — ABNORMAL LOW (ref 4.23–5.67)
RDW: 14.1 % (ref 11.9–14.6)
WBC: 10.9 10*3/uL (ref 4.3–11.1)

## 2012-08-06 LAB — PTT: aPTT: 29.2 s (ref 23.5–31.7)

## 2012-08-06 LAB — MRSA SCREEN - PCR (NASAL)

## 2012-08-06 LAB — FIBRINOGEN: Fibrinogen: 664 mg/dL — ABNORMAL HIGH (ref 172–437)

## 2012-08-06 MED ORDER — SODIUM CHLORIDE 0.9 % IV
INTRAVENOUS | Status: DC
Start: 2012-08-06 — End: 2012-08-06
  Administered 2012-08-06: 16:00:00 via INTRA_ARTERIAL

## 2012-08-06 MED ORDER — MORPHINE 4 MG/ML SYRINGE
4 mg/mL | INTRAMUSCULAR | Status: DC | PRN
Start: 2012-08-06 — End: 2012-08-07
  Administered 2012-08-07 (×2): via INTRAVENOUS

## 2012-08-06 MED ORDER — IODIXANOL 320 MG/ML IV SOLN
320 mg iodine/mL | Freq: Once | INTRAVENOUS | Status: AC
Start: 2012-08-06 — End: 2012-08-06
  Administered 2012-08-06: 17:00:00

## 2012-08-06 MED ORDER — ONDANSETRON (PF) 4 MG/2 ML INJECTION
4 mg/2 mL | Freq: Four times a day (QID) | INTRAMUSCULAR | Status: DC | PRN
Start: 2012-08-06 — End: 2012-08-07

## 2012-08-06 MED ORDER — HEPARIN (PORCINE) 1,000 UNIT/ML IJ SOLN
1000 unit/mL | INTRAMUSCULAR | Status: AC
Start: 2012-08-06 — End: ?

## 2012-08-06 MED ORDER — IBUPROFEN 400 MG TAB
400 mg | Freq: Four times a day (QID) | ORAL | Status: DC | PRN
Start: 2012-08-06 — End: 2012-08-13
  Administered 2012-08-13: 02:00:00 via ORAL

## 2012-08-06 MED ORDER — DIPHENHYDRAMINE HCL 50 MG/ML IJ SOLN
50 mg/mL | INTRAMUSCULAR | Status: AC
Start: 2012-08-06 — End: ?

## 2012-08-06 MED ORDER — SODIUM CHLORIDE 0.9 % IV
Freq: Once | INTRAVENOUS | Status: AC
Start: 2012-08-06 — End: 2012-08-06
  Administered 2012-08-06: 15:00:00 via INTRAVENOUS

## 2012-08-06 MED ORDER — SODIUM BICARBONATE 4 % IV
4 % | INTRAVENOUS | Status: AC
Start: 2012-08-06 — End: ?

## 2012-08-06 MED ORDER — MUPIROCIN 2 % OINTMENT
2 % | Freq: Two times a day (BID) | CUTANEOUS | Status: AC
Start: 2012-08-06 — End: 2012-08-11
  Administered 2012-08-07 – 2012-08-11 (×11): via NASAL

## 2012-08-06 MED ORDER — ACETAMINOPHEN 500 MG TAB
500 mg | Freq: Four times a day (QID) | ORAL | Status: DC | PRN
Start: 2012-08-06 — End: 2012-08-07
  Administered 2012-08-06: 21:00:00 via ORAL

## 2012-08-06 MED ORDER — FENTANYL CITRATE (PF) 50 MCG/ML IJ SOLN
50 mcg/mL | INTRAMUSCULAR | Status: AC
Start: 2012-08-06 — End: ?

## 2012-08-06 MED ORDER — SODIUM BICARBONATE 4 % IV
4 % | Freq: Once | INTRAVENOUS | Status: AC
Start: 2012-08-06 — End: 2012-08-06
  Administered 2012-08-06: 16:00:00 via SUBCUTANEOUS

## 2012-08-06 MED ORDER — HEPARIN (PORCINE) 1,000 UNIT/ML IJ SOLN
1000 unit/mL | Freq: Once | INTRAMUSCULAR | Status: AC
Start: 2012-08-06 — End: 2012-08-06
  Administered 2012-08-06: 15:00:00 via INTRAVENOUS

## 2012-08-06 MED ADMIN — fentaNYL citrate (PF) injection 25-50 mcg: INTRAVENOUS | @ 16:00:00 | NDC 00409909422

## 2012-08-06 MED ADMIN — 0.9% sodium chloride infusion: INTRAVENOUS | @ 18:00:00 | NDC 00409798309

## 2012-08-06 MED ADMIN — midazolam (VERSED) injection 0.5-2 mg: INTRAVENOUS | @ 16:00:00 | NDC 00641605701

## 2012-08-06 MED ADMIN — midazolam (VERSED) injection 0.5-2 mg: INTRAVENOUS | @ 15:00:00 | NDC 00641605701

## 2012-08-06 MED ADMIN — lidocaine (XYLOCAINE) 20 mg/mL (2 %) injection 100-400 mg: INTRADERMAL | @ 16:00:00 | NDC 00409427701

## 2012-08-06 MED ADMIN — fentaNYL citrate (PF) injection 25-50 mcg: INTRAVENOUS | @ 17:00:00 | NDC 00409909422

## 2012-08-06 MED ADMIN — heparin (PF) 2 units/ml in NS infusion 2,000 Units: @ 15:00:00 | NDC 00409762059

## 2012-08-06 MED ADMIN — sodium chloride (NS) flush 5-10 mL: INTRAVENOUS | @ 11:00:00 | NDC 87701099893

## 2012-08-06 MED ADMIN — heparin 25,000 units in dextrose 500 mL infusion: INTRAVENOUS | @ 16:00:00 | NDC 00009029101

## 2012-08-06 MED ADMIN — heparin 25,000 units in dextrose 500 mL infusion: INTRAVENOUS | @ 18:00:00 | NDC 00264957710

## 2012-08-06 MED ADMIN — sodium bicarbonate (NEUT) injection 40 mg: SUBCUTANEOUS | @ 17:00:00 | NDC 00409660902

## 2012-08-06 MED ADMIN — fentaNYL citrate (PF) injection 25-50 mcg: INTRAVENOUS | @ 15:00:00 | NDC 00409909422

## 2012-08-06 MED ADMIN — oxyCODONE-acetaminophen (PERCOCET) 5-325 mg per tablet 1 Tab: ORAL | @ 03:00:00 | NDC 00406051223

## 2012-08-06 MED ADMIN — midazolam (VERSED) injection 0.5-2 mg: INTRAVENOUS | @ 17:00:00 | NDC 63323041112

## 2012-08-06 MED ADMIN — sodium chloride (NS) flush 5-10 mL: INTRAVENOUS | @ 03:00:00 | NDC 87701099893

## 2012-08-06 MED ADMIN — alteplase (ACTIVASE) 25 mg in 0.9% sodium chloride 500 mL infusion: INTRAVENOUS | @ 18:00:00 | NDC 50242004413

## 2012-08-06 MED ADMIN — HYDROcodone-acetaminophen (NORCO) 5-325 mg per tablet 1-2 Tab: ORAL | @ 19:00:00 | NDC 68084036811

## 2012-08-06 MED ADMIN — diphenhydrAMINE (BENADRYL) injection 50 mg: INTRAVENOUS | @ 15:00:00 | NDC 63323066401

## 2012-08-06 MED ADMIN — sodium chloride (NS) flush 5-10 mL: INTRAVENOUS | @ 18:00:00 | NDC 87701099893

## 2012-08-06 MED ADMIN — lidocaine (XYLOCAINE) 20 mg/mL (2 %) injection 100-400 mg: INTRADERMAL | @ 17:00:00 | NDC 00409427701

## 2012-08-06 MED FILL — OXYCODONE-ACETAMINOPHEN 5 MG-325 MG TAB: 5-325 mg | ORAL | Qty: 1

## 2012-08-06 MED FILL — ONDANSETRON (PF) 4 MG/2 ML INJECTION: 4 mg/2 mL | INTRAMUSCULAR | Qty: 2

## 2012-08-06 MED FILL — HEPARIN (PORCINE) IN NS (PF) 2,000 UNIT/1,000 ML IV: 2000 unit/1,000 mL | INTRAVENOUS | Qty: 4000

## 2012-08-06 MED FILL — MAPAP EXTRA STRENGTH 500 MG TABLET: 500 mg | ORAL | Qty: 1

## 2012-08-06 MED FILL — MIDAZOLAM 1 MG/ML IJ SOLN: 1 mg/mL | INTRAMUSCULAR | Qty: 6

## 2012-08-06 MED FILL — ZOLPIDEM 5 MG TAB: 5 mg | ORAL | Qty: 1

## 2012-08-06 MED FILL — FENTANYL CITRATE (PF) 50 MCG/ML IJ SOLN: 50 mcg/mL | INTRAMUSCULAR | Qty: 6

## 2012-08-06 MED FILL — ACTIVASE 50 MG INTRAVENOUS SOLUTION: 50 mg | INTRAVENOUS | Qty: 25

## 2012-08-06 MED FILL — HEPARIN (PORCINE) IN D5W 25,000 UNIT/500 ML IV: 25000 unit/500 mL (50 unit/mL) | INTRAVENOUS | Qty: 500

## 2012-08-06 MED FILL — HEPARIN (PORCINE) 1,000 UNIT/ML IJ SOLN: 1000 unit/mL | INTRAMUSCULAR | Qty: 10

## 2012-08-06 MED FILL — DIPHENHYDRAMINE HCL 50 MG/ML IJ SOLN: 50 mg/mL | INTRAMUSCULAR | Qty: 1

## 2012-08-06 MED FILL — NEUT 4 % INTRAVENOUS SOLUTION: 4 % | INTRAVENOUS | Qty: 1

## 2012-08-06 MED FILL — LIDOCAINE HCL 2 % (20 MG/ML) IJ SOLN: 20 mg/mL (2 %) | INTRAMUSCULAR | Qty: 20

## 2012-08-06 MED FILL — SODIUM CHLORIDE 0.9 % IV: INTRAVENOUS | Qty: 1000

## 2012-08-06 MED FILL — HYDROCODONE-ACETAMINOPHEN 5 MG-325 MG TAB: 5-325 mg | ORAL | Qty: 1

## 2012-08-06 NOTE — Progress Notes (Signed)
TPA turned off as ordered

## 2012-08-06 NOTE — Progress Notes (Signed)
Report given Nash Mantis.

## 2012-08-06 NOTE — Progress Notes (Signed)
Hospitalist Progress Note    Subjective:   Daily Progress Note: 08/06/2012 4:31 PM    Patient seen and examined.  Chart and RN notes reviewed    Seen post-procedure in ICU  Doing ok, but spiked a temp  Will be NPO after MN again for venogram in am    Current Facility-Administered Medications   Medication Dose Route Frequency   ??? [COMPLETED] 0.9% sodium chloride infusion  25 mL/hr IntraVENous ONCE   ??? heparin (PF) 2 units/ml in NS infusion 2,000 Units  1,000 mL Irrigation PRN   ??? heparin (porcine) 1,000 unit/mL injection 5,000 Units  5,000 Units IntraVENous ONCE   ??? [COMPLETED] lidocaine (XYLOCAINE) 20 mg/mL (2 %) injection 100-400 mg  5-20 mL IntraDERMal ONCE   ??? [COMPLETED] sodium bicarbonate (NEUT) injection 40 mg  1 mL SubCUTAneous ONCE   ??? [COMPLETED] diphenhydrAMINE (BENADRYL) injection 50 mg  50 mg IntraVENous ONCE   ??? [COMPLETED] iodixanol (VISIPAQUE) 320 mg iodine/mL contrast injection 80 mL  80 mL InterCATHeter RAD ONCE   ??? [COMPLETED] lidocaine (XYLOCAINE) 20 mg/mL (2 %) injection 100-400 mg  5-20 mL IntraDERMal ONCE   ??? [COMPLETED] sodium bicarbonate (NEUT) injection 40 mg  1 mL SubCUTAneous ONCE   ??? ondansetron (ZOFRAN) injection 8 mg  8 mg IntraVENous Q6H PRN   ??? alteplase (ACTIVASE) 25 mg in 0.9% sodium chloride 500 mL infusion  1 mg/hr IntraCATHeter- VENous CONTINUOUS   ??? 0.9% sodium chloride infusion  100 mL/hr IntraVENous CONTINUOUS   ??? heparin 25,000 units in dextrose 500 mL infusion  400 Units/hr IntraVENous CONTINUOUS   ??? morphine injection 1-3 mg  1-3 mg IntraVENous Q1H PRN   ??? HYDROcodone-acetaminophen (NORCO) 5-325 mg per tablet 1-2 Tab  1-2 Tab Oral Q4H PRN   ??? acetaminophen (TYLENOL) tablet 500 mg  500 mg Oral Q6H PRN   ??? ibuprofen (MOTRIN) tablet 400 mg  400 mg Oral Q6H PRN   ??? mupirocin (BACTROBAN) 2 % ointment   Both Nostrils Q12H   ??? [COMPLETED] enoxaparin (LOVENOX) injection 70 mg  70 mg SubCUTAneous Q12H   ??? senna-docusate (PERICOLACE) 8.6-50 mg per tablet 1 Tab  1 Tab Oral BID   ???  zolpidem (AMBIEN) tablet 5 mg  5 mg Oral QHS PRN   ??? sodium chloride (NS) flush 5-10 mL  5-10 mL IntraVENous Q8H   ??? sodium chloride (NS) flush 5-10 mL  5-10 mL IntraVENous PRN   ??? oxyCODONE-acetaminophen (PERCOCET) 5-325 mg per tablet 1 Tab  1 Tab Oral Q4H PRN   ??? ondansetron (ZOFRAN) injection 4 mg  4 mg IntraVENous Q4H PRN        Review of Systems  Pertinent items are noted in HPI.    Objective:     BP 122/81   Pulse 76   Temp(Src) 101.7 ??F (38.7 ??C)   Resp 14   Ht 6' (1.829 m)   Wt 68.5 kg (151 lb 0.2 oz)   BMI 20.48 kg/m2   SpO2 98% O2 Flow Rate (L/min): 3 l/min O2 Device: Room air    Temp (24hrs), Avg:99.7 ??F (37.6 ??C), Min:97.7 ??F (36.5 ??C), Max:101.7 ??F (38.7 ??C)              BP 122/81   Pulse 76   Temp(Src) 101.7 ??F (38.7 ??C)   Resp 14   Ht 6' (1.829 m)   Wt 68.5 kg (151 lb 0.2 oz)   BMI 20.48 kg/m2   SpO2 98%  General appearance: fatigued, cooperative, no distress,  appears stated age  Lungs: clear to auscultation bilaterally  Heart: regular rate and rhythm, S1, S2 normal, no murmur, click, rub or gallop  Abdomen: soft, non-tender. Bowel sounds normal. No masses,  no organomegaly  Extremities: extremities normal, atraumatic, no cyanosis or edema    Additional comments:I reviewed the patient's new clinical lab test results. labs reviewed    Data Review    Recent Results (from the past 24 hour(s))   CBC W/O DIFF    Collection Time    08/06/12  1:30 PM       Result Value Range    WBC 10.9  4.3 - 11.1 K/uL    RBC 4.03 (*) 4.23 - 5.67 M/uL    HGB 11.9 (*) 13.6 - 17.2 g/dL    HCT 16.1 (*) 09.6 - 50.3 %    MCV 87.1  79.6 - 97.8 FL    MCH 29.5  26.1 - 32.9 PG    MCHC 33.9  31.4 - 35.0 g/dL    RDW 04.5  40.9 - 81.1 %    PLATELET 284  150 - 450 K/uL    MPV 9.2 (*) 10.8 - 14.1 FL   PTT    Collection Time    08/06/12  1:30 PM       Result Value Range    aPTT 29.2  23.5 - 31.7 SEC   FIBRINOGEN    Collection Time    08/06/12  1:30 PM       Result Value Range    Fibrinogen 664 (*) 172 - 437 mg/dL   MRSA SCREEN - PCR  (NASAL)    Collection Time    08/06/12  1:30 PM       Result Value Range    Specimen Description: NASAL      Special Requests: NO SPECIAL REQUESTS      Culture result:        Value: MRSA target DNA is detected (presumptive positive for MRSA colonization).      RESULTS VERIFIED, PHONED TO AND READ BACK BY Bunnie Pion RN BY JEFF BURRELL AT [phone]    Report Status 08/06/2012 FINAL           Assessment/Plan:     Principal Problem:    DVT (deep venous thrombosis) (08/03/2012), s/p Temporary CVC placement. IVC Filter placement. Initiation of EKOS catheter tPA thrombolysis by Dr. Wendi Maya      Active Problems:    Abnormal heart rate (08/03/2012)    PLAN:  1) tPA at 1 mg/hr overnight. Heparin 400 U/hr. Full liquid diet today/tonight. NPO p MN  2) venogram tomorrow  3) hopefully start coumadin in am      Care Plan discussed with: Patient/Family and Nurse    Total time spent with patient: 25 minutes.    [facility] Devona Konig, MD

## 2012-08-06 NOTE — Progress Notes (Signed)
Therapist is discontinuing physical therapy at this time per departmental protocol due to transfer to a critical care unit.  Mr. Whitby physical therapy goals were not met.  Please reorder PT when our services are again appropriate.  Thank you.  Brett Fairy, PT  08/06/2012

## 2012-08-06 NOTE — Progress Notes (Signed)
Pt received to room 3105 and placed on monitor. Assessment completed, see doc flow sheets. Pt resting, denies any pain or discomfort at this time. Sheath to R popliteal, dressing c/d/i, no bleeding or hematoma noted to site, heparin/tpa/collant/ekos infusing. RLE pulses palpable. Pt instructed on importance of keeping R leg straight, verbalizes understanding. Mother at bedside and updated. VSS, will continue to monitor closely.

## 2012-08-06 NOTE — Progress Notes (Signed)
Dr Haskell Riling on phone new orders received

## 2012-08-06 NOTE — Progress Notes (Signed)
TRANSFER - IN REPORT:    Verbal report received from Liz RN(name) on Brynden Khonsu Roe  being received from Room 3105(unit) for routine progression of care      Report consisted of patient???s Situation, Background, Assessment and   Recommendations(SBAR).     Information from the following report(s) Procedure Summary was reviewed with the receiving nurse.    Opportunity for questions and clarification was provided.      Assessment completed upon patient???s arrival to unit and care assumed.

## 2012-08-06 NOTE — Progress Notes (Signed)
LEAPFROG PROTOCOL NOTE    Howard Harris  08/06/2012    The patient is currently in the critical care setting managed by hospitalist with DVT.  The patient's chart is reviewed and the patient is discussed with the staff.  Patient is currently hemodynamically stable.  Patient has no needs identified for Intensivist management in the critical care setting at this time.  Please notify us if can be of assistance.  No charge billed to the patient.  Thank you.    Dala Dock, MD

## 2012-08-06 NOTE — Procedures (Signed)
Interventional Radiology Brief Procedure Note    Patient: Howard Harris MRN: [id number]  SSN: [id number]    Date of Birth: 02/18/1971  Age: 41 y.o.  Sex: male      Date of Procedure: 08/06/2012    Procedure(s): Temporary CVC placement.  IVC Filter placement.  Initiation of EKOS catheter tPA thrombolysis.      Performed By: Kaelum Kissick J Takayla Baillie, MD     Anesthesia: Moderate Sedation    Estimated Blood Loss: Less than 10ml    Specimens: None    Findings: Patient RIJV.  Normal IVC, R CIV.  Clot from mid R EIV to the below-knee Pop V.     Complications: None    Implants: CVC tip in RA.  IVC Filter.     Plan: tPA at 1 mg/hr overnight.  Heparin 400 U/hr.  Full liquid diet today/tonight.  NPO p MN.      Follow Up: Venogram tomorrow.      Signed By: Vicent Febles J Miliano Cotten, MD     August 06, 2012

## 2012-08-06 NOTE — Progress Notes (Signed)
Pt c/o nausea zofran given as ordered

## 2012-08-06 NOTE — Progress Notes (Signed)
Dr Alphonsus Sias paged to give update on labs and pt status

## 2012-08-06 NOTE — Progress Notes (Signed)
TRANSFER - IN REPORT:    Verbal report received from [facility] on [employer]  being received from IR  for routine post - op      Report consisted of patient???s Situation, Background, Assessment and   Recommendations(SBAR).     Information from the following report(s) SBAR, Kardex and Procedure Summary was reviewed with the receiving nurse.    Opportunity for questions and clarification was provided.      Assessment completed upon patient???s arrival to unit and care assumed.

## 2012-08-06 NOTE — Progress Notes (Addendum)
TPA turned on as ordered at .5mg /hr

## 2012-08-06 NOTE — Progress Notes (Signed)
TRANSFER - OUT REPORT:    Verbal report given to Leonette Most RN on [employer]  being transferred to   IR for ordered procedure       Report consisted of patient???s Situation, Background, Assessment and   Recommendations(SBAR).     Information from the following report(s) SBAR, Kardex and MAR was reviewed with the receiving nurse.    Opportunity for questions and clarification was provided.

## 2012-08-06 NOTE — Progress Notes (Signed)
Hospitalist paged pt spitting up small blood clots and nose with blood clots

## 2012-08-06 NOTE — Procedures (Signed)
Interventional Radiology Brief Procedure Note    Patient: Howard Harris MRN: [id number]  SSN: [id number]    Date of Birth: 1970-12-14  Age: 41 y.o.  Sex: male      Date of Procedure: 08/06/2012    Procedure(s): Temporary CVC placement.  IVC Filter placement.  Initiation of EKOS catheter tPA thrombolysis.      Performed By: Lottie Dawson, MD     Anesthesia: Moderate Sedation    Estimated Blood Loss: Less than 10ml    Specimens: None    Findings: Patient RIJV.  Normal IVC, R CIV.  Clot from mid R EIV to the below-knee Pop V.     Complications: None    Implants: CVC tip in RA.  IVC Filter.     Plan: tPA at 1 mg/hr overnight.  Heparin 400 U/hr.  Full liquid diet today/tonight.  NPO p MN.      Follow Up: Venogram tomorrow.      Signed By: Lottie Dawson, MD     August 06, 2012

## 2012-08-07 LAB — METABOLIC PANEL, BASIC
Anion gap: 8 mmol/L (ref 7–16)
BUN: 10 MG/DL (ref 6–23)
CO2: 25 MMOL/L (ref 21–32)
Calcium: 8.5 MG/DL (ref 8.3–10.4)
Chloride: 102 MMOL/L (ref 98–107)
Creatinine: 0.91 MG/DL (ref 0.8–1.5)
GFR est AA: >60 mL/min/{1.73_m2} (ref >60)
GFR est non-AA: >60 mL/min/{1.73_m2} (ref >60)
Glucose: 107 MG/DL — ABNORMAL HIGH (ref 65–100)
Potassium: 4.4 MMOL/L (ref 3.5–5.1)
Sodium: 135 MMOL/L — ABNORMAL LOW (ref 136–145)

## 2012-08-07 LAB — CBC W/O DIFF
HCT: 34.5 % — ABNORMAL LOW (ref 41.1–50.3)
HCT: 33.8 % — ABNORMAL LOW (ref 41.1–50.3)
HCT: 32.7 % — ABNORMAL LOW (ref 41.1–50.3)
HGB: 11.6 g/dL — ABNORMAL LOW (ref 13.6–17.2)
HGB: 11.6 g/dL — ABNORMAL LOW (ref 13.6–17.2)
HGB: 11.2 g/dL — ABNORMAL LOW (ref 13.6–17.2)
MCH: 29.4 PG (ref 26.1–32.9)
MCH: 29.6 PG (ref 26.1–32.9)
MCH: 29.6 PG (ref 26.1–32.9)
MCHC: 33.6 g/dL (ref 31.4–35.0)
MCHC: 34.3 g/dL (ref 31.4–35.0)
MCHC: 34.3 g/dL (ref 31.4–35.0)
MCV: 87.3 FL (ref 79.6–97.8)
MCV: 86.2 FL (ref 79.6–97.8)
MCV: 86.5 FL (ref 79.6–97.8)
MPV: 9.1 FL — ABNORMAL LOW (ref 10.8–14.1)
MPV: 9 FL — ABNORMAL LOW (ref 10.8–14.1)
MPV: 9.1 FL — ABNORMAL LOW (ref 10.8–14.1)
PLATELET: 241 10*3/uL (ref 150–450)
PLATELET: 215 10*3/uL (ref 150–450)
PLATELET: 229 10*3/uL (ref 150–450)
RBC: 3.95 M/uL — ABNORMAL LOW (ref 4.23–5.67)
RBC: 3.92 M/uL — ABNORMAL LOW (ref 4.23–5.67)
RBC: 3.78 M/uL — ABNORMAL LOW (ref 4.23–5.67)
RDW: 14.1 % (ref 11.9–14.6)
RDW: 14.2 % (ref 11.9–14.6)
RDW: 14.1 % (ref 11.9–14.6)
WBC: 12 10*3/uL — ABNORMAL HIGH (ref 4.3–11.1)
WBC: 12.6 10*3/uL — ABNORMAL HIGH (ref 4.3–11.1)
WBC: 10.7 10*3/uL (ref 4.3–11.1)

## 2012-08-07 LAB — PROTHROMBIN TIME + INR
INR: 1.4 — ABNORMAL HIGH (ref 0.9–1.2)
Prothrombin time: 15.6 s — ABNORMAL HIGH (ref 9.6–11.6)

## 2012-08-07 LAB — FIBRINOGEN
Fibrinogen: 125 mg/dL — ABNORMAL LOW (ref 172–437)
Fibrinogen: 54 mg/dL — ABNORMAL LOW (ref 172–437)
Fibrinogen: 60 mg/dL — ABNORMAL LOW (ref 172–437)
Fibrinogen: 56 mg/dL — ABNORMAL LOW (ref 172–437)
Fibrinogen: 55 mg/dL — ABNORMAL LOW (ref 172–437)

## 2012-08-07 LAB — PTT
aPTT: 45.7 s — ABNORMAL HIGH (ref 23.5–31.7)
aPTT: 52.3 s — ABNORMAL HIGH (ref 23.5–31.7)
aPTT: 41.8 s — ABNORMAL HIGH (ref 23.5–31.7)

## 2012-08-07 MED ORDER — FENTANYL CITRATE (PF) 50 MCG/ML IJ SOLN
50 mcg/mL | INTRAMUSCULAR | Status: DC | PRN
Start: 2012-08-07 — End: 2012-08-07
  Administered 2012-08-07 (×8): via INTRAVENOUS

## 2012-08-07 MED ORDER — HEPARIN (PORCINE) IN D5W 25,000 UNIT/500 ML IV
25000 unit/500 mL (50 unit/mL) | INTRAVENOUS | Status: DC
Start: 2012-08-07 — End: 2012-08-10
  Administered 2012-08-07 – 2012-08-10 (×12): via INTRAVENOUS

## 2012-08-07 MED ORDER — WARFARIN 7.5 MG TAB
7.5 mg | Freq: Once | ORAL | Status: AC
Start: 2012-08-07 — End: 2012-08-07
  Administered 2012-08-08: 03:00:00 via ORAL

## 2012-08-07 MED ORDER — FENTANYL CITRATE (PF) 50 MCG/ML IJ SOLN
50 mcg/mL | INTRAMUSCULAR | Status: AC
Start: 2012-08-07 — End: ?

## 2012-08-07 MED ORDER — IODIXANOL 320 MG/ML IV SOLN
320 mg iodine/mL | Freq: Once | INTRAVENOUS | Status: AC
Start: 2012-08-07 — End: 2012-08-07
  Administered 2012-08-07: 17:00:00 via INTRAVENOUS

## 2012-08-07 MED ORDER — MIDAZOLAM 1 MG/ML IJ SOLN
1 mg/mL | INTRAMUSCULAR | Status: DC | PRN
Start: 2012-08-07 — End: 2012-08-07
  Administered 2012-08-07 (×8): via INTRAVENOUS

## 2012-08-07 MED ORDER — HEPARIN (PORCINE) IN NS (PF) 2,000 UNIT/1,000 ML IV
2000 unit/1,000 mL | INTRAVENOUS | Status: AC
Start: 2012-08-07 — End: ?

## 2012-08-07 MED ORDER — SODIUM BICARBONATE 4 % IV
4 % | INTRAVENOUS | Status: AC
Start: 2012-08-07 — End: ?

## 2012-08-07 MED ORDER — DIPHENHYDRAMINE HCL 50 MG/ML IJ SOLN
50 mg/mL | INTRAMUSCULAR | Status: AC
Start: 2012-08-07 — End: ?

## 2012-08-07 MED ADMIN — sodium chloride (NS) flush 5-10 mL: INTRAVENOUS | @ 21:00:00 | NDC 87701099893

## 2012-08-07 MED ADMIN — 0.9% sodium chloride infusion: INTRAVENOUS | @ 03:00:00 | NDC 00409798309

## 2012-08-07 MED ADMIN — lidocaine (XYLOCAINE) 20 mg/mL (2 %) injection 120 mg: INTRADERMAL | @ 16:00:00 | NDC 00409427701

## 2012-08-07 MED ADMIN — sodium chloride (NS) flush 5-10 mL: INTRAVENOUS | @ 03:00:00 | NDC 87701099893

## 2012-08-07 MED ADMIN — 0.9% sodium chloride infusion: INTRAVENOUS | @ 13:00:00 | NDC 00409798309

## 2012-08-07 MED ADMIN — diphenhydrAMINE (BENADRYL) injection 50 mg: INTRAVENOUS | @ 15:00:00 | NDC 63323066401

## 2012-08-07 MED ADMIN — 0.9% sodium chloride infusion 500 mL: INTRAVENOUS | @ 16:00:00 | NDC 00409798303

## 2012-08-07 MED ADMIN — sodium chloride (NS) flush 5-10 mL: INTRAVENOUS | @ 12:00:00 | NDC 87701099893

## 2012-08-07 MED ADMIN — senna-docusate (PERICOLACE) 8.6-50 mg per tablet 1 Tab: ORAL | @ 23:00:00 | NDC 00039000210

## 2012-08-07 MED ADMIN — 0.9% sodium chloride infusion: INTRAVENOUS | @ 21:00:00 | NDC 00409798309

## 2012-08-07 MED ADMIN — sodium bicarbonate (NEUT) injection 80 mg: SUBCUTANEOUS | @ 16:00:00 | NDC 00409660902

## 2012-08-07 MED ADMIN — heparin (PF) 2 units/ml in NS infusion 2,000 Units: @ 16:00:00 | NDC 00409762059

## 2012-08-07 MED FILL — FENTANYL CITRATE (PF) 50 MCG/ML IJ SOLN: 50 mcg/mL | INTRAMUSCULAR | Qty: 4

## 2012-08-07 MED FILL — ONDANSETRON (PF) 4 MG/2 ML INJECTION: 4 mg/2 mL | INTRAMUSCULAR | Qty: 2

## 2012-08-07 MED FILL — HEPARIN (PORCINE) 1,000 UNIT/ML IJ SOLN: 1000 unit/mL | INTRAMUSCULAR | Qty: 10

## 2012-08-07 MED FILL — HEPARIN (PORCINE) IN NS (PF) 2,000 UNIT/1,000 ML IV: 2000 unit/1,000 mL | INTRAVENOUS | Qty: 2000

## 2012-08-07 MED FILL — SENNA PLUS 8.6 MG-50 MG TABLET: ORAL | Qty: 1

## 2012-08-07 MED FILL — SODIUM CHLORIDE 0.9 % IV: INTRAVENOUS | Qty: 1000

## 2012-08-07 MED FILL — ZOLPIDEM 5 MG TAB: 5 mg | ORAL | Qty: 1

## 2012-08-07 MED FILL — MIDAZOLAM 1 MG/ML IJ SOLN: 1 mg/mL | INTRAMUSCULAR | Qty: 2

## 2012-08-07 MED FILL — FENTANYL CITRATE (PF) 50 MCG/ML IJ SOLN: 50 mcg/mL | INTRAMUSCULAR | Qty: 2

## 2012-08-07 MED FILL — NEUT 4 % INTRAVENOUS SOLUTION: 4 % | INTRAVENOUS | Qty: 1

## 2012-08-07 MED FILL — MIDAZOLAM 1 MG/ML IJ SOLN: 1 mg/mL | INTRAMUSCULAR | Qty: 4

## 2012-08-07 MED FILL — MUPIROCIN 2 % OINTMENT: 2 % | CUTANEOUS | Qty: 22

## 2012-08-07 MED FILL — MORPHINE 4 MG/ML SYRINGE: 4 mg/mL | INTRAMUSCULAR | Qty: 1

## 2012-08-07 MED FILL — DIPHENHYDRAMINE HCL 50 MG/ML IJ SOLN: 50 mg/mL | INTRAMUSCULAR | Qty: 1

## 2012-08-07 MED FILL — LIDOCAINE HCL 2 % (20 MG/ML) IJ SOLN: 20 mg/mL (2 %) | INTRAMUSCULAR | Qty: 20

## 2012-08-07 NOTE — Progress Notes (Signed)
Skin assessment completed by this RN and Lanora Manis, RN. Pts skin very dry. No other skin issues noted.

## 2012-08-07 NOTE — Other (Signed)
Interdisciplinary team rounds were held 08/07/2012 with the following team members:Care Management, Nursing, Nutrition, Outcomes Management, Palliative Care, Pastoral Care, Pharmacy, Physician, Respiratory Therapy, Wound Care and Clinical Coordinator and the patient.    Plan of care discussed. See clinical pathway and/or care plan for interventions and desired outcomes.

## 2012-08-07 NOTE — Progress Notes (Signed)
TRANSFER - OUT REPORT:    Verbal report given to [facility] RN on [employer]  being transferred to 723(unit) for routine progression of care       Report consisted of patient???s Situation, Background, Assessment and   Recommendations(SBAR).     Information from the following report(s) SBAR, Kardex and Intake/Output was reviewed with the receiving nurse.    Opportunity for questions and clarification was provided.

## 2012-08-07 NOTE — Progress Notes (Signed)
Pt transfer to 723 w/o difficulty . Urinal emptied , blood drawn for PTT

## 2012-08-07 NOTE — Progress Notes (Signed)
Bedside and Verbal shift change report given to Sheryle Hail, RN (oncoming nurse) by Barbie Banner, RN (offgoing nurse).  Report given with SBAR, Kardex, Procedure Summary, Intake/Output, MAR and Recent Results.

## 2012-08-07 NOTE — Progress Notes (Signed)
Heparin remains off while waiting on lab results

## 2012-08-07 NOTE — Progress Notes (Signed)
Pt anxiety up. States he needs nausea  meds. Zofran 4 mg given IVP for complaint

## 2012-08-07 NOTE — Progress Notes (Signed)
RX: Warfarin dosing per pharmacist    Howard Harris is a 41 y.o. male.    Height: 6' (182.9 cm)    Weight: 71.2 kg (156 lb 15.5 oz)    Indication:  DVT    Goal INR:  2-3    Home dose:  none    Risk factors or significant drug interactions:  None    Other anticoagulants:  Heparin infusion    Daily Monitoring  Date  INR     Warfarin dose HGB              Notes  12/31  1.4  7.5 mg  11.2    Consulted by Dr Wendi Maya on 08/07/12:     Patient s/p EKOS for Howard Harris DVT.  IVC filter placed. Received heparin/alteplase during past 24 hours.  Those have been discontinued and EKOS removed.  Heparin infusion for bridging to ordered warfarin started.  Will initiate at 7.5 mg x1 tonight and then reduce to 5 mg HS starting tomorrow night.  Will f/u daily with INRs and alter dosing as needed.      Thank you,  Damaris Hippo, PharmD  Critical Care Clinical Pharmacist  [phone]

## 2012-08-07 NOTE — Progress Notes (Signed)
Report given to L[place] RN.

## 2012-08-07 NOTE — Progress Notes (Signed)
Pt report received , assessment complete. Pt becoming agitated wants to be moved "so I can go to sleep". Explained waiting on room assignment to transfer.

## 2012-08-07 NOTE — Progress Notes (Signed)
SW D/C Planning Note (08/07/2012): Patient is a 41 y/o MAAM admitted with a DVT - now s/p thrombectomy with IVC filter placement - patient has been employed full time but is without insurance (self-pay) at the present time - he has been indpendent with all ADLS PTA and no prior use of HH or DME - he will be started on COumadin shortly and will need to be re-assessed prior to D/C for any post D/C needs including medication and financial assistance. Kem Kays, LMSW

## 2012-08-07 NOTE — Progress Notes (Signed)
TRANSFER - IN REPORT:    Verbal report received from [place], RN(name) on Howard Harris  being received from ICU(unit) for routine progression of care      Report consisted of patient???s Situation, Background, Assessment and   Recommendations(SBAR).     Information from the following report(s) SBAR, Kardex, [facility] and Recent Results was reviewed with the receiving nurse.    Opportunity for questions and clarification was provided.      Assessment completed upon patient???s arrival to unit and care assumed.

## 2012-08-07 NOTE — Other (Signed)
TRANSFER - OUT REPORT:    Verbal report given to liz rn  on Howard Harris  being transferred to 3105 for routine progression of care       Report consisted of patient???s Situation, Background, Assessment and   Recommendations(SBAR).     Information from the following report(s) SBAR, Procedure Summary and MAR was reviewed with the receiving nurse.    Opportunity for questions and clarification was provided.

## 2012-08-07 NOTE — Progress Notes (Signed)
Assessment based on interaction with patient's mother. Patient appeared to be sleeping resting. 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 Will follow as needed.  Howard Harris, M.Howard Harris Howard Harris NACC  [name]  Spiritual Care Assessment/Progress Notes    Howard Harris [id number]  [id number]    Mar 05, 1971  41 y.o.  male    Patient Telephone Number: [phone] (home)   Religious Affiliation: Jewish   Language: English   Extended Emergency Contact Information  Primary Emergency Contact: HowardDebra  Address: [address]           [place], [place] [address] [place]  Home Phone: [phone]  Work Phone: [phone]  Relation: Other Relative   Patient Active Problem List    Diagnosis Date Noted   ??? DVT (deep venous thrombosis) 08/03/2012   ??? 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  []          Not able to assess due to medical condition    Services Provided Today:  []          crisis intervention    []          reading Scriptures  [x]          spiritual assessment    []          prayer  [x]          empathic listening/emotional support  []          rites and rituals (cite in comments)  []          life review     []          religious support  []          theological development   []          advocacy  []          ethical dialog     []           blessing  []          bereavement support    [x]          support to family  []          anticipatory grief support   []          help with AMD  []          spiritual guidance    []          meditation      Spiritual Care Needs  []   Emotional Support  []          Spiritual/Religious Care  []          Loss/Adjustment  []          Advocacy/Referral /Ethics  [x]          No needs expressed at this time  []          Other: (note in comments)  Spiritual Care Plan  []          Follow up visits with pt/family  []          Provide materials  []          Schedule sacraments  []          Contact Community Clergy  [x]          Follow up as needed  []          Other: (note in comments)     Comments: see note above    Howard Harris

## 2012-08-07 NOTE — Procedures (Signed)
Interventional Radiology Brief Procedure Note    Patient: Howard Harris MRN: [id number]  SSN: [id number]    Date of Birth: 09/10/1970  Age: 41 y.o.  Sex: male      Date of Procedure: 08/07/2012    Procedure(s): RLE Venogram w mechanical thrombectomy, suction thrombectomy, angioplasty.     Performed By: Von Quintanar J Emersynn Deatley, MD     Anesthesia: Moderate Sedation    Estimated Blood Loss: 250-300 cc during thrombectomy    Specimens: None    Findings: Right FV is small diameter after near complete thrombus removal.  I suspect that this represents just one flow channel in a duplicated/triplicated system.  There is now in-line flow from PopV thru FV, CFV, EIV to the widely patent CIV.  Patent SSV Extension.  Patent proximal GSV.      Complications: None    Implants: None    Plan: 1 hour bedrest, then OK to ambulate.  Restart heparin at 1600, now using weight based protocol w NO bolus.  Start Coumadin (if not already started).       Follow Up: In office in 3 months w pre-visit US to discuss IVC filter retrieval.      Signed By: Howard Pember J Vincent Streater, MD     August 07, 2012

## 2012-08-07 NOTE — Progress Notes (Signed)
Hospitalist Progress Note    Subjective:   Daily Progress Note: 08/07/2012 6:31 PM    Patient seen and examined.  Chart and RN notes reviewed  Doing ok post-procedure  Multiple family members at bedside    Current Facility-Administered Medications   Medication Dose Route Frequency   ??? [COMPLETED] lidocaine (XYLOCAINE) 20 mg/mL (2 %) injection 120 mg  120 mg IntraDERMal ONCE   ??? [COMPLETED] diphenhydrAMINE (BENADRYL) injection 50 mg  50 mg IntraVENous ONCE   ??? [COMPLETED] heparin (PF) 2 units/ml in NS infusion 2,000 Units  1,000 mL Irrigation ONCE   ??? [COMPLETED] sodium bicarbonate (NEUT) injection 80 mg  2 mL SubCUTAneous ONCE   ??? [COMPLETED] iodixanol (VISIPAQUE) 320 mg iodine/mL contrast injection 200 mL  200 mL IntraVENous RAD ONCE   ??? 0.9% sodium chloride infusion  150 mL/hr IntraVENous CONTINUOUS   ??? heparin 25,000 units in dextrose 500 mL infusion  18-36 Units/kg/hr IntraVENous TITRATE   ??? warfarin (COUMADIN) tablet 7.5 mg  7.5 mg Oral ONCE   ??? [START ON 08/08/2012] warfarin (COUMADIN) tablet 5 mg  5 mg Oral QHS   ??? [EXPIRED] heparin (porcine) 1,000 unit/mL injection 5,000 Units  5,000 Units IntraVENous ONCE   ??? ondansetron (ZOFRAN) injection 8 mg  8 mg IntraVENous Q6H PRN   ??? morphine injection 1-3 mg  1-3 mg IntraVENous Q1H PRN   ??? acetaminophen (TYLENOL) tablet 500 mg  500 mg Oral Q6H PRN   ??? ibuprofen (MOTRIN) tablet 400 mg  400 mg Oral Q6H PRN   ??? mupirocin (BACTROBAN) 2 % ointment   Both Nostrils Q12H   ??? senna-docusate (PERICOLACE) 8.6-50 mg per tablet 1 Tab  1 Tab Oral BID   ??? zolpidem (AMBIEN) tablet 5 mg  5 mg Oral QHS PRN   ??? sodium chloride (NS) flush 5-10 mL  5-10 mL IntraVENous Q8H   ??? sodium chloride (NS) flush 5-10 mL  5-10 mL IntraVENous PRN   ??? oxyCODONE-acetaminophen (PERCOCET) 5-325 mg per tablet 1 Tab  1 Tab Oral Q4H PRN   ??? ondansetron (ZOFRAN) injection 4 mg  4 mg IntraVENous Q4H PRN        Review of Systems  Pertinent items are noted in HPI.    Objective:     BP 142/86   Pulse 81    Temp(Src) 96.8 ??F (36 ??C)   Resp 34   Ht 6' (1.829 m)   Wt 71.2 kg (156 lb 15.5 oz)   BMI 21.28 kg/m2   SpO2 32% O2 Flow Rate (L/min): 2 l/min O2 Device: Nasal cannula    Temp (24hrs), Avg:98.2 ??F (36.8 ??C), Min:96.8 ??F (36 ??C), Max:99.1 ??F (37.3 ??C)      12/31 0700 - 12/31 1859  In: 547.5 [P.O.:240; I.V.:307.5]  Out: 650 [Urine:650]  12/29 1900 - 12/31 0659  In: 2514.7 [P.O.:550; I.V.:1964.7]  Out: 950 [Urine:950]    BP 142/86   Pulse 81   Temp(Src) 96.8 ??F (36 ??C)   Resp 34   Ht 6' (1.829 m)   Wt 71.2 kg (156 lb 15.5 oz)   BMI 21.28 kg/m2   SpO2 32%  General appearance: alert, cooperative, no distress, appears stated age  Lungs: clear to auscultation bilaterally  Heart: regular rate and rhythm, S1, S2 normal, no murmur, click, rub or gallop  Abdomen: soft, non-tender. Bowel sounds normal. No masses,  no organomegaly  Extremities: extremities normal, atraumatic, no cyanosis or edema    Additional comments:I reviewed the patient's new clinical lab test  results. labs reviewed    Data Review    Recent Results (from the past 24 hour(s))   CBC W/O DIFF    Collection Time    08/06/12  7:20 PM       Result Value Range    WBC 12.0 (*) 4.3 - 11.1 K/uL    RBC 3.95 (*) 4.23 - 5.67 M/uL    HGB 11.6 (*) 13.6 - 17.2 g/dL    HCT 16.1 (*) 09.6 - 50.3 %    MCV 87.3  79.6 - 97.8 FL    MCH 29.4  26.1 - 32.9 PG    MCHC 33.6  31.4 - 35.0 g/dL    RDW 04.5  40.9 - 81.1 %    PLATELET 241  150 - 450 K/uL    MPV 9.1 (*) 10.8 - 14.1 FL   PTT    Collection Time    08/06/12  7:20 PM       Result Value Range    aPTT 45.7 (*) 23.5 - 31.7 SEC   FIBRINOGEN    Collection Time    08/06/12  7:20 PM       Result Value Range    Fibrinogen 125 (*) 172 - 437 mg/dL   FIBRINOGEN    Collection Time    08/06/12 10:00 PM       Result Value Range    Fibrinogen 54 (*) 172 - 437 mg/dL   CBC W/O DIFF    Collection Time    08/07/12  1:00 AM       Result Value Range    WBC 12.6 (*) 4.3 - 11.1 K/uL    RBC 3.92 (*) 4.23 - 5.67 M/uL    HGB 11.6 (*) 13.6 - 17.2 g/dL     HCT 91.4 (*) 78.2 - 50.3 %    MCV 86.2  79.6 - 97.8 FL    MCH 29.6  26.1 - 32.9 PG    MCHC 34.3  31.4 - 35.0 g/dL    RDW 95.6  21.3 - 08.6 %    PLATELET 215  150 - 450 K/uL    MPV 9.0 (*) 10.8 - 14.1 FL   PTT    Collection Time    08/07/12  1:00 AM       Result Value Range    aPTT 52.3 (*) 23.5 - 31.7 SEC   FIBRINOGEN    Collection Time    08/07/12  1:00 AM       Result Value Range    Fibrinogen 60 (*) 172 - 437 mg/dL   PROTHROMBIN TIME    Collection Time    08/07/12  5:15 AM       Result Value Range    Prothrombin time 15.6 (*) 9.6 - 11.6 sec    INR 1.4 (*) 0.9 - 1.2     FIBRINOGEN    Collection Time    08/07/12  5:15 AM       Result Value Range    Fibrinogen 56 (*) 172 - 437 mg/dL   CBC W/O DIFF    Collection Time    08/07/12  7:15 AM       Result Value Range    WBC 10.7  4.3 - 11.1 K/uL    RBC 3.78 (*) 4.23 - 5.67 M/uL    HGB 11.2 (*) 13.6 - 17.2 g/dL    HCT 57.8 (*) 46.9 - 50.3 %    MCV 86.5  79.6 - 97.8 FL  MCH 29.6  26.1 - 32.9 PG    MCHC 34.3  31.4 - 35.0 g/dL    RDW 16.1  09.6 - 04.5 %    PLATELET 229  150 - 450 K/uL    MPV 9.1 (*) 10.8 - 14.1 FL   PTT    Collection Time    08/07/12  7:15 AM       Result Value Range    aPTT 41.8 (*) 23.5 - 31.7 SEC   FIBRINOGEN    Collection Time    08/07/12  7:15 AM       Result Value Range    Fibrinogen 55 (*) 172 - 437 mg/dL   METABOLIC PANEL, BASIC    Collection Time    08/07/12  8:20 AM       Result Value Range    Sodium 135 (*) 136 - 145 MMOL/L    Potassium 4.4  3.5 - 5.1 MMOL/L    Chloride 102  98 - 107 MMOL/L    CO2 25  21 - 32 MMOL/L    Anion gap 8  7 - 16 mmol/L    Glucose 107 (*) 65 - 100 MG/DL    BUN 10  6 - 23 MG/DL    Creatinine 4.09  0.8 - 1.5 MG/DL    GFR est AA >81  >19 ml/min/1.50m2    GFR est non-AA >60  >60 ml/min/1.31m2    Calcium 8.5  8.3 - 10.4 MG/DL         Assessment/Plan:     Principal Problem:    DVT (deep venous thrombosis) (08/03/2012)    Active Problems:    Abnormal heart rate (08/03/2012)      PLAN:  1) transfer out of ICU  2) heparin and  coumadin  3) d/c when INR>2    Care Plan discussed with: Patient/Family and Nurse    Total time spent with patient: 25 minutes.    [facility] Devona Konig, MD

## 2012-08-07 NOTE — Progress Notes (Signed)
Next PTT scheduled for 0530 on 08/08/2012.  IV heparin rate has been adjusted based on the most recent PTT results.  16 units/kg/hour  Lab Results   Component Value Date/Time    aPTT 54.8 08/07/2012  9:00 PM

## 2012-08-07 NOTE — Progress Notes (Signed)
Notified from lab that they are having problems with machine and the fibrinigen lab drawn at 115 will not be resulted for another 15 min or so

## 2012-08-07 NOTE — Progress Notes (Signed)
Participated in discussion of patient's care/status with interdisciplinary team. No specific unmet spiritual/emotional needs mentioned. Will follow as needed.  David R. Gillespie, M.Div  Chaplain  [facility]

## 2012-08-07 NOTE — Progress Notes (Signed)
Pt extremely anxious.  States he has had a history of panic attacks in the past.  Per pt doesn't want me to get him anything for this.  Will continue to monitor

## 2012-08-07 NOTE — Progress Notes (Signed)
TRANSFER - OUT REPORT:    Verbal report given to Brad RN on [employer]  being transferred to IR for ordered procedure       Report consisted of patient???s Situation, Background, Assessment and   Recommendations(SBAR).     Information from the following report(s) SBAR, Kardex, Procedure Summary, Intake/Output, MAR, Accordion and Recent Results was reviewed with the receiving nurse.    Opportunity for questions and clarification was provided.

## 2012-08-07 NOTE — Progress Notes (Signed)
Report from [facility]. Assessment completed, see doc flow sheets. Pt with some n/v this am, small amount brown emesis,additional zofran given. Otherwise pt has no c/o, no further coughing up blood or clots, heparin and TPA remain off at this time per orders. R pop sheath site c/d/i, pulses 2+. VSS, will continue to monitor closely.

## 2012-08-07 NOTE — Progress Notes (Signed)
Critical Care Outreach Nurse Progress Report:    Subjective: In to assess pt secondary to transfer from Unit.  MEWS Score: 1 (08/07/12 1900)    Filed Vitals:    08/07/12 1900 08/07/12 2000 08/07/12 2030 08/07/12 2100   BP: 124/69 136/61 132/84 138/72   Pulse: 69 73 73 68   Temp: 97.9 ??F (36.6 ??C)      Resp: 16 20 15 16    Height:       Weight:       SpO2: 99%  91% 85%        Objective: Pt found resting in bed with primary RN at bedside.    Pain Intensity 1: 2 (08/07/12 2228)  Pain Location 1: Abdomen  Pain Intervention(s) 1: Medication (see MAR)  Patient Stated Pain Goal: 0    Assessment: Pt is alert and oriented x 4. R/A sat 100%. HR=73. Pt has palpable pedal and posterior tibial pulses to right leg. Pt encouraged not to cross legs and to do leg exercises  Every couple hours while resting. Exercises demonstrated and explained to pt. Pt was able to return demonstration. Pt denies pain.     Plan: Will follow per protocol.

## 2012-08-07 NOTE — Progress Notes (Signed)
Pt NPO

## 2012-08-07 NOTE — Progress Notes (Signed)
Awaiting lab results from 115 lab draw

## 2012-08-07 NOTE — Procedures (Signed)
Interventional Radiology Brief Procedure Note    Patient: Howard Harris MRN: [id number]  SSN: [id number]    Date of Birth: 03-02-1971  Age: 41 y.o.  Sex: male      Date of Procedure: 08/07/2012    Procedure(s): RLE Venogram w mechanical thrombectomy, suction thrombectomy, angioplasty.     Performed By: Lottie Dawson, MD     Anesthesia: Moderate Sedation    Estimated Blood Loss: 250-300 cc during thrombectomy    Specimens: None    Findings: Right FV is small diameter after near complete thrombus removal.  I suspect that this represents just one flow channel in a duplicated/triplicated system.  There is now in-line flow from PopV thru FV, CFV, EIV to the widely patent CIV.  Patent SSV Extension.  Patent proximal GSV.      Complications: None    Implants: None    Plan: 1 hour bedrest, then OK to ambulate.  Restart heparin at 1600, now using weight based protocol w NO bolus.  Start Coumadin (if not already started).       Follow Up: In office in 3 months w pre-visit [place] to discuss IVC filter retrieval.      Signed By: Lottie Dawson, MD     August 07, 2012

## 2012-08-08 LAB — METABOLIC PANEL, BASIC
Anion gap: 7 mmol/L (ref 7–16)
BUN: 9 MG/DL (ref 6–23)
CO2: 27 MMOL/L (ref 21–32)
Calcium: 8.6 MG/DL (ref 8.3–10.4)
Chloride: 101 MMOL/L (ref 98–107)
Creatinine: 0.94 MG/DL (ref 0.8–1.5)
GFR est AA: >60 mL/min/{1.73_m2} (ref >60)
GFR est non-AA: >60 mL/min/{1.73_m2} (ref >60)
Glucose: 116 MG/DL — ABNORMAL HIGH (ref 65–100)
Potassium: 4 MMOL/L (ref 3.5–5.1)
Sodium: 135 MMOL/L — ABNORMAL LOW (ref 136–145)

## 2012-08-08 LAB — PTT
aPTT: 54.8 s — ABNORMAL HIGH (ref 23.5–31.7)
aPTT: 45.5 s — ABNORMAL HIGH (ref 23.5–31.7)
aPTT: 51.1 s — ABNORMAL HIGH (ref 23.5–31.7)
aPTT: 58.8 s — ABNORMAL HIGH (ref 23.5–31.7)

## 2012-08-08 LAB — CBC W/O DIFF
HCT: 30.2 % — ABNORMAL LOW (ref 41.1–50.3)
HGB: 10.1 g/dL — ABNORMAL LOW (ref 13.6–17.2)
MCH: 28.8 PG (ref 26.1–32.9)
MCHC: 33.4 g/dL (ref 31.4–35.0)
MCV: 86 FL (ref 79.6–97.8)
MPV: 9.2 FL — ABNORMAL LOW (ref 10.8–14.1)
PLATELET: 227 10*3/uL (ref 150–450)
RBC: 3.51 M/uL — ABNORMAL LOW (ref 4.23–5.67)
RDW: 14.1 % (ref 11.9–14.6)
WBC: 9.9 10*3/uL (ref 4.3–11.1)

## 2012-08-08 LAB — PROTHROMBIN TIME + INR
INR: 1.2 (ref 0.9–1.2)
Prothrombin time: 12.4 s — ABNORMAL HIGH (ref 9.6–11.6)

## 2012-08-08 LAB — MAGNESIUM: Magnesium: 2.1 MG/DL (ref 1.8–2.4)

## 2012-08-08 MED ADMIN — 0.9% sodium chloride infusion: INTRAVENOUS | @ 02:00:00 | NDC 00409798309

## 2012-08-08 MED ADMIN — sodium chloride (NS) flush 5-10 mL: INTRAVENOUS | @ 23:00:00 | NDC 87701099893

## 2012-08-08 MED ADMIN — senna-docusate (PERICOLACE) 8.6-50 mg per tablet 1 Tab: ORAL | @ 14:00:00 | NDC 00039000210

## 2012-08-08 MED ADMIN — 0.9% sodium chloride infusion: INTRAVENOUS | @ 05:00:00 | NDC 00409798309

## 2012-08-08 MED ADMIN — senna-docusate (PERICOLACE) 8.6-50 mg per tablet 1 Tab: ORAL | @ 23:00:00 | NDC 00039000210

## 2012-08-08 MED ADMIN — sodium chloride (NS) flush 5-10 mL: INTRAVENOUS | @ 10:00:00 | NDC 87701099893

## 2012-08-08 MED ADMIN — sodium chloride (NS) flush 5-10 mL: INTRAVENOUS | @ 01:00:00 | NDC 87701099893

## 2012-08-08 MED ADMIN — oxyCODONE-acetaminophen (PERCOCET 10) 10-325 mg per tablet 1 Tab: ORAL | @ 03:00:00 | NDC 68084037811

## 2012-08-08 MED ADMIN — 0.9% sodium chloride infusion: INTRAVENOUS | @ 01:00:00 | NDC 00409798309

## 2012-08-08 MED ADMIN — sodium chloride (NS) flush 5-10 mL: INTRAVENOUS | @ 03:00:00 | NDC 87701099893

## 2012-08-08 MED FILL — OXYCODONE-ACETAMINOPHEN 10 MG-325 MG TAB: 10-325 mg | ORAL | Qty: 1

## 2012-08-08 MED FILL — HEPARIN (PORCINE) IN D5W 25,000 UNIT/500 ML IV: 25000 unit/500 mL (50 unit/mL) | INTRAVENOUS | Qty: 500

## 2012-08-08 MED FILL — ZOLPIDEM 5 MG TAB: 5 mg | ORAL | Qty: 1

## 2012-08-08 MED FILL — WARFARIN 7.5 MG TAB: 7.5 mg | ORAL | Qty: 1

## 2012-08-08 MED FILL — SENNA PLUS 8.6 MG-50 MG TABLET: ORAL | Qty: 1

## 2012-08-08 MED FILL — SODIUM CHLORIDE 0.9 % IV: INTRAVENOUS | Qty: 1000

## 2012-08-08 MED FILL — MUPIROCIN 2 % OINTMENT: 2 % | CUTANEOUS | Qty: 22

## 2012-08-08 MED FILL — CITROMA ORAL SOLUTION: ORAL | Qty: 296

## 2012-08-08 NOTE — Progress Notes (Signed)
Hospitalist Progress Note    Subjective:   Daily Progress Note: 08/08/2012 9:45 AM    Complains of constipation today. No BM since Friday. Otherwise doing well. Wishes to speak to the chaplain.     Current Facility-Administered Medications   Medication Dose Route Frequency   ??? magnesium citrate solution 296 mL  296 mL Oral PRN   ??? [COMPLETED] lidocaine (XYLOCAINE) 20 mg/mL (2 %) injection 120 mg  120 mg IntraDERMal ONCE   ??? [COMPLETED] diphenhydrAMINE (BENADRYL) injection 50 mg  50 mg IntraVENous ONCE   ??? [COMPLETED] heparin (PF) 2 units/ml in NS infusion 2,000 Units  1,000 mL Irrigation ONCE   ??? [COMPLETED] sodium bicarbonate (NEUT) injection 80 mg  2 mL SubCUTAneous ONCE   ??? [COMPLETED] iodixanol (VISIPAQUE) 320 mg iodine/mL contrast injection 200 mL  200 mL IntraVENous RAD ONCE   ??? heparin 25,000 units in dextrose 500 mL infusion  18-36 Units/kg/hr IntraVENous TITRATE   ??? [COMPLETED] warfarin (COUMADIN) tablet 7.5 mg  7.5 mg Oral ONCE   ??? warfarin (COUMADIN) tablet 5 mg  5 mg Oral QHS   ??? 0.9% sodium chloride infusion  75 mL/hr IntraVENous CONTINUOUS   ??? acetaminophen (TYLENOL) tablet 650 mg  650 mg Oral Q6H PRN   ??? oxyCODONE-acetaminophen (PERCOCET 10) 10-325 mg per tablet 1 Tab  1 Tab Oral Q6H PRN   ??? nalOXone (NARCAN) injection 0.4 mg  0.4 mg IntraVENous PRN   ??? ibuprofen (MOTRIN) tablet 400 mg  400 mg Oral Q6H PRN   ??? mupirocin (BACTROBAN) 2 % ointment   Both Nostrils Q12H   ??? senna-docusate (PERICOLACE) 8.6-50 mg per tablet 1 Tab  1 Tab Oral BID   ??? zolpidem (AMBIEN) tablet 5 mg  5 mg Oral QHS PRN   ??? sodium chloride (NS) flush 5-10 mL  5-10 mL IntraVENous Q8H   ??? sodium chloride (NS) flush 5-10 mL  5-10 mL IntraVENous PRN   ??? ondansetron (ZOFRAN) injection 4 mg  4 mg IntraVENous Q4H PRN        Review of Systems  Pertinent items are noted in HPI.    Objective:     BP 130/71   Pulse 63   Temp(Src) 98 ??F (36.7 ??C)   Resp 20   Ht 6' (1.829 m)   Wt 71.2 kg (156 lb 15.5 oz)   BMI 21.28 kg/m2   SpO2 98% O2 Flow  Rate (L/min): 2 l/min O2 Device: Room air    Temp (24hrs), Avg:98.3 ??F (36.8 ??C), Min:96.8 ??F (36 ??C), Max:99.6 ??F (37.6 ??C)         12/30 1900 - 01/01 0659  In: 2338.2 [P.O.:490; I.V.:1848.2]  Out: 2270 [Urine:2050]    BP 130/71   Pulse 63   Temp(Src) 98 ??F (36.7 ??C)   Resp 20   Ht 6' (1.829 m)   Wt 71.2 kg (156 lb 15.5 oz)   BMI 21.28 kg/m2   SpO2 98%  General appearance: alert, cooperative, no distress, appears stated age  Lungs: clear to auscultation bilaterally  Heart: regular rate and rhythm, S1, S2 normal, no murmur, click, rub or gallop  Abdomen: soft, non-tender. Bowel sounds normal. No masses,  no organomegaly  Extremities: extremities normal, atraumatic, no cyanosis or edema    Additional comments: i have reviewed updated labs.     Data Review    Recent Results (from the past 24 hour(s))   PTT    Collection Time    08/07/12  9:00 PM  Result Value Range    aPTT 54.8 (*) 23.5 - 31.7 SEC   METABOLIC PANEL, BASIC    Collection Time    08/08/12  5:05 AM       Result Value Range    Sodium 135 (*) 136 - 145 MMOL/L    Potassium 4.0  3.5 - 5.1 MMOL/L    Chloride 101  98 - 107 MMOL/L    CO2 27  21 - 32 MMOL/L    Anion gap 7  7 - 16 mmol/L    Glucose 116 (*) 65 - 100 MG/DL    BUN 9  6 - 23 MG/DL    Creatinine 4.69  0.8 - 1.5 MG/DL    GFR est AA >62  >95 ml/min/1.34m2    GFR est non-AA >60  >60 ml/min/1.68m2    Calcium 8.6  8.3 - 10.4 MG/DL   CBC W/O DIFF    Collection Time    08/08/12  5:05 AM       Result Value Range    WBC 9.9  4.3 - 11.1 K/uL    RBC 3.51 (*) 4.23 - 5.67 M/uL    HGB 10.1 (*) 13.6 - 17.2 g/dL    HCT 28.4 (*) 13.2 - 50.3 %    MCV 86.0  79.6 - 97.8 FL    MCH 28.8  26.1 - 32.9 PG    MCHC 33.4  31.4 - 35.0 g/dL    RDW 44.0  10.2 - 72.5 %    PLATELET 227  150 - 450 K/uL    MPV 9.2 (*) 10.8 - 14.1 FL   PTT    Collection Time    08/08/12  5:05 AM       Result Value Range    aPTT 45.5 (*) 23.5 - 31.7 SEC   PROTHROMBIN TIME    Collection Time    08/08/12  5:05 AM       Result Value Range    Prothrombin time  12.4 (*) 9.6 - 11.6 sec    INR 1.2  0.9 - 1.2     MAGNESIUM    Collection Time    08/08/12  5:05 AM       Result Value Range    Magnesium 2.1  1.8 - 2.4 MG/DL         Assessment/Plan:     Principal Problem:    DVT (deep venous thrombosis) (08/03/2012): cont heparin/coumadin bridge until INR >=2.0. S/p thrombolysis by IR. Vascular surg following. Will d/c when INR therapeutic.     Active Problems:    Abnormal heart rate (08/03/2012)      Constipation: will give mag citrate today.       Care Plan discussed with: Patient/Family and Nurse    Total time spent with patient: 20 minutes.

## 2012-08-08 NOTE — Progress Notes (Signed)
Date of Outreach Update:  Howard Harris was seen and assessed.      MEWS Score: 1 (08/08/12 0800)  Filed Vitals:    08/07/12 2100 08/07/12 2300 08/08/12 0453 08/08/12 0800   BP: 138/72 128/77 136/71 130/71   Pulse: 68 70 70 63   Temp:  99.6 ??F (37.6 ??C) 99.4 ??F (37.4 ??C) 98 ??F (36.7 ??C)   Resp: 16 20 20 20    Height:       Weight:       SpO2: 85% 98% 98% 98%         Pain Assessment  Pain Intensity 1: 0 (08/08/12 0723)  Pain Location 1: Leg  Pain Intervention(s) 1: Medication (see MAR)  Patient Stated Pain Goal: 0      Previous Outreach assessment has been reviewed.  There have been no significant clinical changes since the completion of the last dated Outreach assessment.  Pt is alert and oriented, denies pain, VSS.    Will continue to follow up per outreach protocol.    Signed By:   Gearlean Alf, RN    August 08, 2012 11:54 AM

## 2012-08-08 NOTE — Progress Notes (Signed)
Date of Outreach Update:  Howard Harris was seen and assessed.  Previous Outreach assessment has been reviewed.  There have been no significant clinical changes since the completion of the last dated Outreach assessment. Pt sleeping. Visitor at bedside. Pt in no distress. Pt easily arouses to name. Denies any pain or discomfort at present. Heparin gtt in progress.    Will continue to follow up per outreach protocol.    Signed By:   Patterson Hammersmith, RN    August 08, 2012 3:45 AM

## 2012-08-08 NOTE — Progress Notes (Signed)
Hospitalist Attending  I personally evaluated the patient and agree with the documentation as recorded above.  Jatoria Kneeland B. Eleazar Kimmey, MD

## 2012-08-08 NOTE — Progress Notes (Signed)
Critical Care Outreach Nurse Progress Report:    Subjective: In to assess pt secondary to f/u ICU transfer.  MEWS Score: 1 (08/08/12 2240)    Filed Vitals:    08/08/12 1226 08/08/12 1630 08/08/12 2021 08/08/12 2240   BP: 115/66 127/73 118/71 115/64   Pulse: 74 62 77 72   Temp: 98 ??F (36.7 ??C) 99.1 ??F (37.3 ??C) 99.3 ??F (37.4 ??C) 99 ??F (37.2 ??C)   Resp: 20 18 20 16    Height:       Weight:       SpO2: 97% 90% 100% 98%        Objective: Pt resting quietly in bed, in NAD.    Pain Intensity 1: 0 (08/08/12 2024)  Pain Location 1: Leg  Pain Intervention(s) 1: Ambulation/Increased Activity  Patient Stated Pain Goal: 0    Assessment: A&Ox4.  Lung sounds CTA.  RLE pulses palpable.  Popliteal site drsg cdi.  Pt has been up ambulating in hallway today.  Has some discomfort but denies any pain in RLE or other complaints at this time.    Plan: Discussed heparin gtt/coumadin bridge w/pt and answered questions.  Will provide pt w/education informations on coumadin therapy.  Will follow per outreach protocol.

## 2012-08-08 NOTE — Progress Notes (Signed)
Problem: Mobility Impaired (Adult and Pediatric)  Goal: *Acute Goals and Plan of Care (Insert Text)  GOALS:  (1.)Mr. Hollingshed will move from supine to sit and sit to supine , scoot up and down and roll side to side with INDEPENDENCE within 7 day(s).   (2.)Mr. Vangieson will transfer from bed to chair and chair to bed with INDEPENDENCE using the least restrictive device within 7 day(s).   (3.)Mr. Klutz will ambulate with INDEPENDENCE for 450 feet with the least restrictive device within 7 day(s).   ________________________________________________________________________________________________  PHYSICAL THERAPY: INITIAL ASSESSMENT AND PM  INPATIENT: Self-pay : Hospital Day: 6    NAME/AGE/GENDER: Howard Harris is a 42 y.o. male  DATE: 08/08/2012  PRIMARY DIAGNOSIS: DVT (deep venous thrombosis)          INTERDISCIPLINARY COLLABORATION: Physical Therapist  ASSESSMENT:   Mr. Clear presents in supine with his mother present for this assessment.  While he does perform OOB mobility with close to independence, he will benefit from at least one more session of acute physical therapy in order to ensure safe and consistent gait technique and to practice stairs.      ????????This section established at most recent assessment??????????  PROBLEM LIST (Impairments causing functional limitations):  1. Decreased Strength affecting function  2. Decreased ADL/Functional Activities  3. Decreased Transfer Abilities  4. Decreased Ambulation Ability/Technique  5. Increased Pain affecting function  6. Decreased Flexibility/Joint Mobility  7. Decreased Independence with Home Exercise Program  REHABILITATION POTENTIAL FOR STATED GOALS: EXCELLENT      PLAN OF CARE:   INTERVENTIONS PLANNED: (Benefits and precautions of physical therapy have been discussed with the patient.)  1. balance exercise  2. bed mobility  3. cold  4. family education  5. gait training  6. home exercise program (HEP)  7. therapeutic activities  8. therapeutic exercise/strengthening  9.  transfer training  FREQUENCY/DURATION: Follow patient 1-2 times per day/4-7 days per week until goals are met in order to address above goals.    RECOMMENDED REHABILITATION/EQUIPMENT: (at time of discharge pending progress):   None.  SUBJECTIVE:   "OK"    Present Symptoms:    Pain Intensity 1: 1  Pain Location 1: Leg  Pain Orientation 1: Right;Upper;Inner  Pain Intervention(s) 1: Ambulation/Increased Activity  History of Present Injury/Illness: s/p above surgery   Prior Level of Function/Home Situation:   Home Environment: Private residence  # Steps to Enter: 3  One/Two Story Residence: One story  Living Alone: No  Support Systems: Parent  Patient Expects to be Discharged to:: Private residence  Current DME Used/Available at Home: Crutches  OBJECTIVE/TREATMENT:   (In addition to Assessment/Re-Assessment sessions the following treatments were rendered)                                      [employer]??? ???6 Clicks???                                          Basic Mobility Inpatient Short Form  How much difficulty does the patient currently have... Unable A Lot A Little None   1.  Turning over in bed (including adjusting bedclothes, sheets and blankets)?   [ ]  1   [ ]  2   [X]  3   [ ]  4   2.  Sitting down on and standing up from a chair with arms ( e.g., wheelchair, bedside commode, etc.)   [ ]  1   [ ]  2   [X]  3   [ ]  4   3.  Moving from lying on back to sitting on the side of the bed?   [ ]  1   [ ]  2   [X]  3   [ ]  4               How much help from another person does the patient currently need... Total A Lot A Little None   4.  Moving to and from a bed to a chair (including a wheelchair)?   [ ]  1   [ ]  2   [X]  3   [ ]  4   5.  Need to walk in hospital room?   [ ]  1   [ ]  2   [X]  3   [ ]  4   6.  Climbing 3-5 steps with a railing?   [ ]  1   [ ]  2   [X]  3   [ ]  4   ?? 2007, Trustees of [address], under license to [place], [place]. All rights reserved       Score:  Initial: 18 Most Recent: X (Date: -- )    Interpretation of Tool:  Represents activities that are increasingly more difficult (i.e. Bed mobility, Transfers, Gait).  Score 24 23 22-20 19-15 14-10 9-7 6   Modifier CH CI CJ CK CL CM CN       ?? Mobility - Walking and Moving Around:              [phone] - CURRENT STATUS:        CK - 40%-59% impaired, limited or restricted              [id number] - GOAL STATUS:                CK - 40%-59% impaired, limited or restricted              [id number] - D/C STATUS:                    ---------------To be determined---------------  Payor: SELF PAY  Plan: [facility] SELF PAY  Product Type: Self Pay     Most Recent Physical Functioning:   Gross Assessment:  AROM: Generally decreased, functional  PROM: Generally decreased, functional  Strength: Generally decreased, functional  Coordination: Generally decreased, functional  Tone: Normal  Sensation: Intact  Posture:  Posture (WDL): Within defined limits  Balance:  Sitting: Intact  Standing: Intact  Bed Mobility:  Supine to Sit: Additional time  Sit to Supine: Additional time  Scooting: Additional time  Wheelchair Mobility:     Transfers:  Sit to Stand: CGA  Stand to Sit: CGA  Gait:     Base of Support: Shift to left  Speed/Cadence: Pace decreased (<100 feet/min)  Step Length: Left shortened;Right shortened  Gait Abnormalities: Antalgic  Distance (ft): 250 Feet (ft)  Assistive Device:  (pushing IV pole)  Ambulation - Level of Assistance: SBA  Interventions: Safety awareness training;Tactile cues;Verbal cues      Assessment/Reassessment only, no treatment provided today    Braces/Orthotics/Lines/Etc:   ?? IV  ?? O2 Device: Room air  Safety:   After treatment position/precautions:  ?? Supine in bed  ?? Bed/Chair-wheels locked  ?? Call light within reach  ?? RN notified  ??  Family at bedside  Progression/Medical Necessity:   ?? Patient is expected to demonstrate progress in strength, range of motion, balance, coordination and functional technique to increase independence with mobility.  Compliance with  Program/Exercises: compliant all of the time.   Reason for Continuation of Services/Other Comments:  ?? Patient continues to require present interventions due to patient's inability to mobilize independently.  Recommendations/Intent for next treatment session: Treatment next visit will focus on advancements to more challenging activities and reduction in assistance provided.  Total Treatment Duration:  Time In: 1520  Time Out: 1545  Clarkson Rosselli K Terril Amaro, PT

## 2012-08-08 NOTE — Progress Notes (Signed)
Date of Outreach Update:  Howard Harris was seen and assessed.  Previous Outreach assessment has been reviewed.  There have been no significant clinical changes since the completion of the last dated Outreach assessment. Pt is sleeping at this time. No distress noted.    Will continue to follow up per outreach protocol.    Signed By:   Patterson Hammersmith, RN    August 08, 2012 6:06 AM

## 2012-08-08 NOTE — Progress Notes (Signed)
Date of Outreach Update:  Howard Harris was seen and assessed.      MEWS Score: 1 (08/08/12 0800)  Filed Vitals:    08/07/12 2300 08/08/12 0453 08/08/12 0800 08/08/12 1226   BP: 128/77 136/71 130/71 115/66   Pulse: 70 70 63 74   Temp: 99.6 ??F (37.6 ??C) 99.4 ??F (37.4 ??C) 98 ??F (36.7 ??C) 98 ??F (36.7 ??C)   Resp: 20 20 20 20    Height:       Weight:       SpO2: 98% 98% 98% 97%         Pain Assessment  Pain Intensity 1: 0 (08/08/12 1524)  Pain Location 1: Leg  Pain Intervention(s) 1: Ambulation/Increased Activity  Patient Stated Pain Goal: 0      Previous Outreach assessment has been reviewed.  There have been no significant clinical changes since the completion of the last dated Outreach assessment.      Will continue to follow up per outreach protocol.    Signed By:   Gearlean Alf, RN    August 08, 2012 3:54 PM

## 2012-08-08 NOTE — Progress Notes (Signed)
RX: Warfarin dosing per pharmacist    Howard Harris is a 42 y.o. male.    Height: 6' (182.9 cm)    Weight: 71.2 kg (156 lb 15.5 oz)    Indication:  DVT    Goal INR:  2-3    Home dose:  none    Risk factors or significant drug interactions:  None    Other anticoagulants:  Heparin infusion    Daily Monitoring  Date  INR     Warfarin dose HGB              Notes  12/31  1.4  7.5 mg  11.2  1/1                   1.2                      5 mg             10.1    Consulted by Dr Wendi Maya on 08/07/12:     Patient s/p EKOS for R DVT.  IVC filter placed. Received heparin/alteplase during past 24 hours.  Those have been discontinued and EKOS removed.   Pt has received 7.5mg  X 1 and will continue with 5mg  tonight. Will continue to follow patient.    Thank you,  Hulan Amato, PharmD  Oncology clinical pharmacist  [phone]

## 2012-08-09 LAB — CBC WITH AUTOMATED DIFF
ABS. BASOPHILS: 0.1 10*3/uL (ref 0.0–0.2)
ABS. EOSINOPHILS: 0.8 10*3/uL (ref 0.0–0.8)
ABS. IMM. GRANS.: 0 10*3/uL (ref 0.0–0.5)
ABS. LYMPHOCYTES: 1.5 10*3/uL (ref 0.5–4.6)
ABS. MONOCYTES: 1.2 10*3/uL (ref 0.1–1.3)
ABS. NEUTROPHILS: 4.8 10*3/uL (ref 1.7–8.2)
BASOPHILS: 1 % (ref 0.0–2.0)
EOSINOPHILS: 10 % — ABNORMAL HIGH (ref 0.5–7.8)
HCT: 30.6 % — ABNORMAL LOW (ref 41.1–50.3)
HGB: 10.4 g/dL — ABNORMAL LOW (ref 13.6–17.2)
IMMATURE GRANULOCYTES: 0.4 % (ref 0.0–5.0)
LYMPHOCYTES: 18 % (ref 13–44)
MCH: 29.2 PG (ref 26.1–32.9)
MCHC: 34 g/dL (ref 31.4–35.0)
MCV: 86 FL (ref 79.6–97.8)
MONOCYTES: 14 % — ABNORMAL HIGH (ref 4.0–12.0)
MPV: 9.4 FL — ABNORMAL LOW (ref 10.8–14.1)
NEUTROPHILS: 57 % (ref 43–78)
PLATELET: 289 10*3/uL (ref 150–450)
RBC: 3.56 M/uL — ABNORMAL LOW (ref 4.23–5.67)
RDW: 13.7 % (ref 11.9–14.6)
WBC: 8.3 10*3/uL (ref 4.3–11.1)

## 2012-08-09 LAB — METABOLIC PANEL, BASIC
Anion gap: 6 mmol/L — ABNORMAL LOW (ref 7–16)
BUN: 10 MG/DL (ref 6–23)
CO2: 27 MMOL/L (ref 21–32)
Calcium: 8.9 MG/DL (ref 8.3–10.4)
Chloride: 102 MMOL/L (ref 98–107)
Creatinine: 0.94 MG/DL (ref 0.8–1.5)
GFR est AA: >60 mL/min/{1.73_m2} (ref >60)
GFR est non-AA: >60 mL/min/{1.73_m2} (ref >60)
Glucose: 96 MG/DL (ref 65–100)
Potassium: 4.2 MMOL/L (ref 3.5–5.1)
Sodium: 135 MMOL/L — ABNORMAL LOW (ref 136–145)

## 2012-08-09 LAB — PROTHROMBIN TIME + INR
INR: 1.2 (ref 0.9–1.2)
Prothrombin time: 13.2 s — ABNORMAL HIGH (ref 9.6–11.6)

## 2012-08-09 LAB — PTT
aPTT: 39.5 s — ABNORMAL HIGH (ref 23.5–31.7)
aPTT: 43.4 s — ABNORMAL HIGH (ref 23.5–31.7)

## 2012-08-09 MED ADMIN — senna-docusate (PERICOLACE) 8.6-50 mg per tablet 1 Tab: ORAL | @ 14:00:00 | NDC 68084005011

## 2012-08-09 MED ADMIN — warfarin (COUMADIN) tablet 5 mg: ORAL | @ 03:00:00 | NDC 00056017201

## 2012-08-09 MED ADMIN — sodium chloride (NS) flush 5-10 mL: INTRAVENOUS | @ 03:00:00 | NDC 87701099893

## 2012-08-09 MED ADMIN — sodium chloride (NS) flush 5-10 mL: INTRAVENOUS | @ 11:00:00 | NDC 87701099893

## 2012-08-09 MED ADMIN — sodium chloride (NS) flush 5-10 mL: INTRAVENOUS | @ 19:00:00 | NDC 87701099893

## 2012-08-09 MED FILL — ZOLPIDEM 5 MG TAB: 5 mg | ORAL | Qty: 1

## 2012-08-09 MED FILL — SENNA PLUS 8.6 MG-50 MG TABLET: ORAL | Qty: 1

## 2012-08-09 MED FILL — MUPIROCIN 2 % OINTMENT: 2 % | CUTANEOUS | Qty: 22

## 2012-08-09 MED FILL — HEPARIN (PORCINE) IN D5W 25,000 UNIT/500 ML IV: 25000 unit/500 mL (50 unit/mL) | INTRAVENOUS | Qty: 500

## 2012-08-09 MED FILL — WARFARIN 5 MG TAB: 5 mg | ORAL | Qty: 1

## 2012-08-09 NOTE — Progress Notes (Signed)
Spiritual Care visit.Follow up visit.    Requested visit. Conversed with patient about his interests and about his aspirations.  He said he has explored various religions, but now identifies more with Messianic Judaism.  Would Like to become an Science writer, perhaps in the prison system.  Chaplain discussed the pathway to becoming a Orthoptist.  Affirmed his interests and his aspirations.  Prayed with him for guidance toward his goals.    Visit by Arelia Sneddon, M.Ed., Th.B. ,Staff Chaplain

## 2012-08-09 NOTE — Progress Notes (Signed)
RX: Warfarin dosing per pharmacist     Howard Harris is a 42 y.o. Male  .   Height: 6' (182.9 cm) Weight: 71.2 kg (156 lb 15.5 oz)     Indication: DVT     Goal INR: 2-3   Home dose: none   Risk factors or significant drug interactions: None   Other anticoagulants: Heparin infusion   Daily Monitoring   Date  INR  Warfarin dose  HGB   Notes   12/31   1.4  7.5 mg   11.2   1/1   1.2   5 mg    10.1  1/2   1.2  5mg     10.4   Consulted by Dr Wendi Maya on 08/07/12:     Will continue with 5mg  of warfarin and heparin drip infusing for bridging. Will continue to monitor.    Thanks,  [place], [place]  [phone]

## 2012-08-09 NOTE — Progress Notes (Addendum)
Hospitalist Progress Note    Subjective:   Daily Progress Note: 08/09/2012 9:11 AM    Pt is doing better today. Less swelling and concerned about spiritual support and finances. Interested in lovenox bridge as option for treatment.     Current Facility-Administered Medications   Medication Dose Route Frequency   ??? warfarin (COUMADIN) tablet 5 mg  5 mg Oral QHS   ??? magnesium citrate solution 296 mL  296 mL Oral PRN   ??? heparin 25,000 units in dextrose 500 mL infusion  18-36 Units/kg/hr IntraVENous TITRATE   ??? 0.9% sodium chloride infusion  75 mL/hr IntraVENous CONTINUOUS   ??? acetaminophen (TYLENOL) tablet 650 mg  650 mg Oral Q6H PRN   ??? oxyCODONE-acetaminophen (PERCOCET 10) 10-325 mg per tablet 1 Tab  1 Tab Oral Q6H PRN   ??? nalOXone (NARCAN) injection 0.4 mg  0.4 mg IntraVENous PRN   ??? ibuprofen (MOTRIN) tablet 400 mg  400 mg Oral Q6H PRN   ??? mupirocin (BACTROBAN) 2 % ointment   Both Nostrils Q12H   ??? senna-docusate (PERICOLACE) 8.6-50 mg per tablet 1 Tab  1 Tab Oral BID   ??? zolpidem (AMBIEN) tablet 5 mg  5 mg Oral QHS PRN   ??? sodium chloride (NS) flush 5-10 mL  5-10 mL IntraVENous Q8H   ??? sodium chloride (NS) flush 5-10 mL  5-10 mL IntraVENous PRN   ??? ondansetron (ZOFRAN) injection 4 mg  4 mg IntraVENous Q4H PRN        Review of Systems  Pertinent items are noted in HPI.    Objective:     BP 100/72   Pulse 78   Temp(Src) 97.8 ??F (36.6 ??C)   Resp 16   Ht 6' (1.829 m)   Wt 71.2 kg (156 lb 15.5 oz)   BMI 21.28 kg/m2   SpO2 97% O2 Flow Rate (L/min): 2 l/min O2 Device: Room air    Temp (24hrs), Avg:98.6 ??F (37 ??C), Min:97.8 ??F (36.6 ??C), Max:99.3 ??F (37.4 ??C)         12/31 1900 - 01/02 0659  In: 301 [I.V.:301]  Out: 2720 [Urine:2500]    General appearance: alert, cooperative, no distress, appears stated age  Lungs: no accessory use  Extremities: no edema  Neurologic: Mental status: Alert, oriented, thought content appropriate    Additional comments:I reviewed the patient's new clinical lab test results. 1.2 on INR    Data  Review    Recent Results (from the past 24 hour(s))   PTT    Collection Time    08/08/12 11:30 AM       Result Value Range    aPTT 51.1 (*) 23.5 - 31.7 SEC   PTT    Collection Time    08/08/12  5:08 PM       Result Value Range    aPTT 58.8 (*) 23.5 - 31.7 SEC   PTT    Collection Time    08/08/12 10:28 PM       Result Value Range    aPTT 39.5 (*) 23.5 - 31.7 SEC   PTT    Collection Time    08/09/12  5:00 AM       Result Value Range    aPTT 43.4 (*) 23.5 - 31.7 SEC   PROTHROMBIN TIME    Collection Time    08/09/12  5:00 AM       Result Value Range    Prothrombin time 13.2 (*) 9.6 - 11.6 sec  INR 1.2  0.9 - 1.2     CBC WITH AUTOMATED DIFF    Collection Time    08/09/12  5:00 AM       Result Value Range    WBC 8.3  4.3 - 11.1 K/uL    RBC 3.56 (*) 4.23 - 5.67 M/uL    HGB 10.4 (*) 13.6 - 17.2 g/dL    HCT 40.9 (*) 81.1 - 50.3 %    MCV 86.0  79.6 - 97.8 FL    MCH 29.2  26.1 - 32.9 PG    MCHC 34.0  31.4 - 35.0 g/dL    RDW 91.4  78.2 - 95.6 %    PLATELET 289  150 - 450 K/uL    MPV 9.4 (*) 10.8 - 14.1 FL    DF AUTOMATED      NEUTROPHILS 57  43 - 78 %    LYMPHOCYTES 18  13 - 44 %    MONOCYTES 14 (*) 4.0 - 12.0 %    EOSINOPHILS 10 (*) 0.5 - 7.8 %    BASOPHILS 1  0.0 - 2.0 %    IMMATURE GRANULOCYTES 0.4  0.0 - 5.0 %    ABS. NEUTROPHILS 4.8  1.7 - 8.2 K/UL    ABS. LYMPHOCYTES 1.5  0.5 - 4.6 K/UL    ABS. MONOCYTES 1.2  0.1 - 1.3 K/UL    ABS. EOSINOPHILS 0.8  0.0 - 0.8 K/UL    ABS. BASOPHILS 0.1  0.0 - 0.2 K/UL    ABS. IMM. GRANS. 0.0  0.0 - 0.5 K/UL   METABOLIC PANEL, BASIC    Collection Time    08/09/12  5:00 AM       Result Value Range    Sodium 135 (*) 136 - 145 MMOL/L    Potassium 4.2  3.5 - 5.1 MMOL/L    Chloride 102  98 - 107 MMOL/L    CO2 27  21 - 32 MMOL/L    Anion gap 6 (*) 7 - 16 mmol/L    Glucose 96  65 - 100 MG/DL    BUN 10  6 - 23 MG/DL    Creatinine 2.13  0.8 - 1.5 MG/DL    GFR est AA >08  >65 ml/min/1.40m2    GFR est non-AA >60  >60 ml/min/1.20m2    Calcium 8.9  8.3 - 10.4 MG/DL         Assessment/Plan:     Principal Problem:     DVT (deep venous thrombosis) (08/03/2012) sp lysis. Also has IVC filter. Plan is removal in 3 monhts.   Has been on Heparin gtt. Interested in Lovenox bridge with coumadin. Would need to find out cost for lovenox. Could we get voucher?  Pt deserves hypercoagulable work up given he seems to have had unprovoked dvt.     Active Problems:    Abnormal heart rate (08/03/2012) tachycardia resolved    Look into Lovenox as option to reduce length of stay. Will need SW assistance.   Concern would also be follow up as without insurance, not sure how he will get INR checked.   PCP is listed as [facility].     Care Plan discussed with: Patient/Family    Total time spent with patient: 20 minutes.

## 2012-08-09 NOTE — Progress Notes (Signed)
Date of Outreach Update:  Howard Harris was seen and status discussed w/Primary Charity fundraiser.  Previous Outreach assessment has been reviewed.  There have been no significant clinical changes since the completion of the last dated Outreach assessment.  Pt resting in bed, NAD, vss.    Will continue to follow up per outreach protocol.    Signed By:   Cranston Neighbor, RN    August 09, 2012 4:33 AM

## 2012-08-09 NOTE — Progress Notes (Signed)
Problem: Patient Education: Go to Patient Education Activity  Goal: Patient/Family Education  Nutrition   Received MD consult for Diet Education: New to Coumadin  Assessment:  Food/Nutrition and Pertinent History: Pt presented with leg pain and finding of extensive DVT. Coumadin was started yesterday                                                                           Nutrition Diagnosis: Food and nutrition knowledge deficit r/t lack of exposure to nutrition related information as evidenced new to coumadin    Intervention:   Goal: Patient will verbalize basic understanding of consistent vitamin K diet  Plan: Provided patient and family with written and verbal instruction on consistent Vitamin K diet to include high and medium vitamin foods, suggested servings.protieon sizes per day,Coumadin booklet and RD name and phone number.    Monitoring/Evaluation:  Patient verbalizes good understanding of diet. Expect good compliance. Pt to call as questions arise. F/U otherwise by private MD.  Harlow Mares, RD, LD, CNSC [phone]

## 2012-08-09 NOTE — Progress Notes (Signed)
Rescheduled Pericolace as PRN.  Patient stated he has had two bowl movements in the past two days and doesn't think he needs medication anymore.

## 2012-08-09 NOTE — Progress Notes (Signed)
Verbal and bedside report given to Kayla, RN

## 2012-08-09 NOTE — Progress Notes (Signed)
Interventional Radiology Progress Note    Subjective:     Daily Progress Note: 08/09/2012 9:17 AM    Feels well this morning.  Pain he experienced in his right groin and right medial thigh prior to procedure has resolved.  Ambulating.  Would like to speak with Child psychotherapist regarding financial concerns.  Also requests Chaplain and nutritionist visit.      Objective:     BP 100/72   Pulse 78   Temp(Src) 97.8 ??F (36.6 ??C)   Resp 16   Ht 6' (1.829 m)   Wt 71.2 kg (156 lb 15.5 oz)   BMI 21.28 kg/m2   SpO2 97% O2 Flow Rate (L/min): 2 l/min O2 Device: Room air    Temp (24hrs), Avg:98.6 ??F (37 ??C), Min:97.8 ??F (36.6 ??C), Max:99.3 ??F (37.4 ??C)         12/31 1900 - 01/02 0659  In: 301 [I.V.:301]  Out: 2720 [Urine:2500]    No LE edema, right leg dressing removed-no bruising or hematoma.    Lab/Data Review:  BMP:   Lab Results   Component Value Date/Time    NA 135* 08/09/2012  5:00 AM    K 4.2 08/09/2012  5:00 AM    CL 102 08/09/2012  5:00 AM    CO2 27 08/09/2012  5:00 AM    AGAP 6* 08/09/2012  5:00 AM    GLU 96 08/09/2012  5:00 AM    BUN 10 08/09/2012  5:00 AM    CREA 0.94 08/09/2012  5:00 AM    GFRAA >60 08/09/2012  5:00 AM    GFRNA >60 08/09/2012  5:00 AM     CBC:   Lab Results   Component Value Date/Time    WBC 8.3 08/09/2012  5:00 AM    HGB 10.4* 08/09/2012  5:00 AM    HCT 30.6* 08/09/2012  5:00 AM    PLT 289 08/09/2012  5:00 AM     COAGS:   Lab Results   Component Value Date/Time    APTT 43.4* 08/09/2012  5:00 AM    PTP 13.2* 08/09/2012  5:00 AM    INR 1.2 08/09/2012  5:00 AM       Assessment/Plan:   Right Leg DVT post thrombolysis.  Coumadin- INR 1.2.    Possible Lovenox bridge, hypercoag work up-per Dr. Andree Moro.  Social Work, Orthoptist, Nutrition.    Arman Bogus, PA-C    Principal Problem:    DVT (deep venous thrombosis) (08/03/2012)    Active Problems:    Abnormal heart rate (08/03/2012)        Continue current plan of care.

## 2012-08-09 NOTE — Progress Notes (Signed)
PTT within therapeutic range X2. Next PTT due at 0530 on 08/10/12. Warfarin fact sheet given to pt.

## 2012-08-09 NOTE — Progress Notes (Signed)
Reviewed Lovenox therapy with patient.  Patient not due for dose administration until tonight.  Will have night nurse demonstrate and have patient self administer.

## 2012-08-09 NOTE — Progress Notes (Signed)
Critical Care Outreach Nurse Progress Report:    Subjective: In to assess pt secondary to f/u ICU transfer.  MEWS Score: 1 (08/09/12 2043)    Filed Vitals:    08/09/12 1057 08/09/12 1414 08/09/12 2043 08/09/12 2348   BP: 115/79 118/69 124/65 116/65   Pulse: 75 92 89 79   Temp: 98.8 ??F (37.1 ??C) 98.4 ??F (36.9 ??C) 99 ??F (37.2 ??C) 98.9 ??F (37.2 ??C)   Resp: 17 16 16 16    Height:       Weight:       SpO2: 98% 97% 98% 100%        Objective: Pt resting quietly in bed.  Family member at bedside.    Pain Intensity 1: 0 (08/09/12 2002)  Pain Location 1: Leg  Pain Intervention(s) 1: Ambulation/Increased Activity  Patient Stated Pain Goal: 0    Assessment: A&Ox4.  RLE pulses palpable.  Denies pain or needs.    Plan: Discussion held today w/pt regarding discharge home w/lovenox bridge.  Pt still undecided and to discuss further w/MD in am.  Will discharge from outreach program.

## 2012-08-09 NOTE — Progress Notes (Signed)
Patient open to lovenox shots as a bridge for coumadin therapy.  Patient also pointed out what appears to be a blister on his lower right inner leg just below the knee.  Advised patient to show it to the MD the next time they round.

## 2012-08-09 NOTE — Progress Notes (Signed)
SW spoke briefly with pt's mother to acknowledge the SW consult and let she and pt know SW will follow up with them tomorrow morning.  She stated the pt would like to speak with a chaplain.  SW contacted Matilde Sprang to notfy him of pt's request.  He stated he would see the pt sometime this evening. SW has received approval from case management supervisor to provide pt with a medication voucher for Lovenox 80mg   #14.  SW will provide pt with a [employer] voucher for $730.83, at discharge.  SW to continue to follow.

## 2012-08-09 NOTE — Progress Notes (Addendum)
Date of Outreach Update:  Howard Harris was seen and assessed.      MEWS Score: 1 (08/08/12 2240)  Filed Vitals:    08/09/12 0430 08/09/12 0738 08/09/12 1057 08/09/12 1414   BP: 100/78 100/72 115/79 118/69   Pulse: 78 78 75 92   Temp: 98.3 ??F (36.8 ??C) 97.8 ??F (36.6 ??C) 98.8 ??F (37.1 ??C) 98.4 ??F (36.9 ??C)   Resp: 16 16 17 16    Height:       Weight:       SpO2: 94% 97% 98% 97%         Pain Assessment  Pain Intensity 1: 0 (08/09/12 0715)  Pain Location 1: Leg  Pain Intervention(s) 1: Ambulation/Increased Activity  Patient Stated Pain Goal: 0      Previous Outreach assessment has been reviewed.  A/O X 4, DP pulses 2+ bi-laterally. RLE edema has nearly resolved compared to LLE. Discussed home use of Lovenox with administration education provided with stated understanding. Denies C/O at this time.    Will continue to follow up per outreach protocol.    Signed By:   Loyal Buba, RN    August 09, 2012 3:00 PM

## 2012-08-10 LAB — CBC WITH AUTOMATED DIFF
ABS. BASOPHILS: 0 10*3/uL (ref 0.0–0.2)
ABS. EOSINOPHILS: 0.7 10*3/uL (ref 0.0–0.8)
ABS. IMM. GRANS.: 0 10*3/uL (ref 0.0–0.5)
ABS. LYMPHOCYTES: 1.3 10*3/uL (ref 0.5–4.6)
ABS. MONOCYTES: 1.1 10*3/uL (ref 0.1–1.3)
ABS. NEUTROPHILS: 4.9 10*3/uL (ref 1.7–8.2)
BASOPHILS: 1 % (ref 0.0–2.0)
EOSINOPHILS: 8 % — ABNORMAL HIGH (ref 0.5–7.8)
HCT: 31.3 % — ABNORMAL LOW (ref 41.1–50.3)
HGB: 10.7 g/dL — ABNORMAL LOW (ref 13.6–17.2)
IMMATURE GRANULOCYTES: 0.1 % (ref 0.0–5.0)
LYMPHOCYTES: 17 % (ref 13–44)
MCH: 29.2 PG (ref 26.1–32.9)
MCHC: 34.2 g/dL (ref 31.4–35.0)
MCV: 85.3 FL (ref 79.6–97.8)
MONOCYTES: 13 % — ABNORMAL HIGH (ref 4.0–12.0)
MPV: 9 FL — ABNORMAL LOW (ref 10.8–14.1)
NEUTROPHILS: 61 % (ref 43–78)
PLATELET: 337 10*3/uL (ref 150–450)
RBC: 3.67 M/uL — ABNORMAL LOW (ref 4.23–5.67)
RDW: 13.5 % (ref 11.9–14.6)
WBC: 7.9 10*3/uL (ref 4.3–11.1)

## 2012-08-10 LAB — PROTHROMBIN TIME + INR
INR: 1.3 — ABNORMAL HIGH (ref 0.9–1.2)
Prothrombin time: 13.5 s — ABNORMAL HIGH (ref 9.6–11.6)

## 2012-08-10 LAB — PTT: aPTT: 53.9 s — ABNORMAL HIGH (ref 23.5–31.7)

## 2012-08-10 MED ADMIN — warfarin (COUMADIN) tablet 5 mg: ORAL | @ 03:00:00 | NDC 00056017201

## 2012-08-10 MED ADMIN — enoxaparin (LOVENOX) injection 70 mg: SUBCUTANEOUS | @ 23:00:00 | NDC 00075801801

## 2012-08-10 MED ADMIN — sodium chloride (NS) flush 5-10 mL: INTRAVENOUS | @ 11:00:00 | NDC 87701099893

## 2012-08-10 MED ADMIN — sodium chloride (NS) flush 5-10 mL: INTRAVENOUS | @ 03:00:00 | NDC 87701099893

## 2012-08-10 MED FILL — LOVENOX 80 MG/0.8 ML SUBCUTANEOUS SYRINGE: 80 mg/0.8 mL | SUBCUTANEOUS | Qty: 0.8

## 2012-08-10 MED FILL — HEPARIN (PORCINE) IN D5W 25,000 UNIT/500 ML IV: 25000 unit/500 mL (50 unit/mL) | INTRAVENOUS | Qty: 500

## 2012-08-10 MED FILL — WARFARIN 5 MG TAB: 5 mg | ORAL | Qty: 1

## 2012-08-10 NOTE — Progress Notes (Signed)
Spiritual Care Assessment  Reviewing chart for spiritual needs of pt/ family  Prayer for patient  Signed by Randy Brookshire, MDiv, chaplain

## 2012-08-10 NOTE — Progress Notes (Signed)
PT Note:  Follow up visit provided to patient.  Patient commented independent with mobility and has ambulated in hall today without pain or balance issues.  Patient reviewed exercises to perform while supine, seated, and standing.  Patient instructed to be cautious if pain, swelling, change in limb temperature, or numbness to stop exercises and notify MD.  Patient is functioning at baseline independent, will discharge from PT at this time.  Please re-consult if mobility problems arise.  Thank you,  Stanford Scotland, DPT

## 2012-08-10 NOTE — Progress Notes (Signed)
Hospitalist Progress Note    Subjective:   Daily Progress Note: 08/10/2012 4:27 PM    Change to lovenox sub q rather than heparin gtt as this is ok with IR>   Will keep here until inr at 2.5 to 3.5 per [facility] request  We dont have good dispo plan for him so need to stay here until INR where we need it.   Pt understands    Current Facility-Administered Medications   Medication Dose Route Frequency   ??? warfarin (COUMADIN) tablet 7.5 mg  7.5 mg Oral QHS   ??? enoxaparin (LOVENOX) injection 70 mg  1 mg/kg SubCUTAneous Q12H   ??? senna-docusate (PERICOLACE) 8.6-50 mg per tablet 1 Tab  1 Tab Oral BID PRN   ??? magnesium citrate solution 296 mL  296 mL Oral PRN   ??? acetaminophen (TYLENOL) tablet 650 mg  650 mg Oral Q6H PRN   ??? oxyCODONE-acetaminophen (PERCOCET 10) 10-325 mg per tablet 1 Tab  1 Tab Oral Q6H PRN   ??? nalOXone (NARCAN) injection 0.4 mg  0.4 mg IntraVENous PRN   ??? ibuprofen (MOTRIN) tablet 400 mg  400 mg Oral Q6H PRN   ??? mupirocin (BACTROBAN) 2 % ointment   Both Nostrils Q12H   ??? zolpidem (AMBIEN) tablet 5 mg  5 mg Oral QHS PRN   ??? sodium chloride (NS) flush 5-10 mL  5-10 mL IntraVENous Q8H   ??? sodium chloride (NS) flush 5-10 mL  5-10 mL IntraVENous PRN   ??? ondansetron (ZOFRAN) injection 4 mg  4 mg IntraVENous Q4H PRN        Review of Systems  Pertinent items are noted in HPI.    Objective:     BP 108/68   Pulse 83   Temp(Src) 98.7 ??F (37.1 ??C)   Resp 18   Ht 6' (1.829 m)   Wt 71.2 kg (156 lb 15.5 oz)   BMI 21.28 kg/m2   SpO2 98% O2 Flow Rate (L/min): 2 l/min O2 Device: Room air    Temp (24hrs), Avg:98.7 ??F (37.1 ??C), Min:98.1 ??F (36.7 ??C), Max:99 ??F (37.2 ??C)      01/03 0700 - 01/03 1859  In: 480 [P.O.:480]  Out: 750 [Urine:750]  01/01 1900 - 01/03 0659  In: 940 [P.O.:940]  Out: 825 [Urine:825]    General appearance: alert, cooperative, no distress, appears stated age  Neurologic: Grossly normal  Psych- calm, appropriate    Additional comments:I reviewed the patient's new clinical lab test results. INR 1.2    Data  Review    Recent Results (from the past 24 hour(s))   PROTHROMBIN TIME    Collection Time    08/10/12  5:55 AM       Result Value Range    Prothrombin time 13.5 (*) 9.6 - 11.6 sec    INR 1.3 (*) 0.9 - 1.2     CBC WITH AUTOMATED DIFF    Collection Time    08/10/12  5:55 AM       Result Value Range    WBC 7.9  4.3 - 11.1 K/uL    RBC 3.67 (*) 4.23 - 5.67 M/uL    HGB 10.7 (*) 13.6 - 17.2 g/dL    HCT 14.7 (*) 82.9 - 50.3 %    MCV 85.3  79.6 - 97.8 FL    MCH 29.2  26.1 - 32.9 PG    MCHC 34.2  31.4 - 35.0 g/dL    RDW 56.2  13.0 - 86.5 %  PLATELET 337  150 - 450 K/uL    MPV 9.0 (*) 10.8 - 14.1 FL    DF AUTOMATED      NEUTROPHILS 61  43 - 78 %    LYMPHOCYTES 17  13 - 44 %    MONOCYTES 13 (*) 4.0 - 12.0 %    EOSINOPHILS 8 (*) 0.5 - 7.8 %    BASOPHILS 1  0.0 - 2.0 %    IMMATURE GRANULOCYTES 0.1  0.0 - 5.0 %    ABS. NEUTROPHILS 4.9  1.7 - 8.2 K/UL    ABS. LYMPHOCYTES 1.3  0.5 - 4.6 K/UL    ABS. MONOCYTES 1.1  0.1 - 1.3 K/UL    ABS. EOSINOPHILS 0.7  0.0 - 0.8 K/UL    ABS. BASOPHILS 0.0  0.0 - 0.2 K/UL    ABS. IMM. GRANS. 0.0  0.0 - 0.5 K/UL   PTT    Collection Time    08/10/12  5:55 AM       Result Value Range    aPTT 53.9 (*) 23.5 - 31.7 SEC         Assessment/Plan:     Principal Problem:    DVT (deep venous thrombosis) (08/03/2012)    Active Problems:    Abnormal heart rate (08/03/2012)      Stay here until INR 2.5 to 3.5 on coumadin dose at 7.;5 mg tonight with heparin gtt off  Needs outpt pcp, hopefully with Dr Laural Benes with [facility] and financial assistance to cover inrs, hospital and outpt follow up.   In 3 months can get ivc filter out, he needs anticoag in meantime even though he has filter as concern is he has hypercoag state, work up would be done out pt with new pcp    Care Plan discussed with: Patient/Family, Nurse, Case Manager and Consultant IR, Pharmacy    Total time spent with patient: 45 minutes. Coordinating care and plannign dispo, safe discharge

## 2012-08-10 NOTE — Progress Notes (Signed)
SW met with pt to discuss discharge needs.  SW provided pt with application for financial assistance for hospital bill.  SW provided pt with most current [facility] $4 list and an application for [facility] medication assistance program.  Unfortunately Lovenox is not covered under either program.  SW provided pt with information about both the [employer] [facility]) and [employer].  SW instructed pt that he can go to the [facility] any day next week at 10am for an intake appointment.  SW will fax pt's face sheet and H&P to the DON of [facility] and provide pt with packet of clinical information to take to the first appointment.  If pt is discharged on Lovenox, SW will provide voucher for one week of medications.  SW strongly enforced the pt to follow up at the [facility] ASAP next week after he is discharged.  He verbalized understanding.  SW remains available to assist as needed and as orders are received.

## 2012-08-10 NOTE — Progress Notes (Signed)
RX: Warfarin dosing per pharmacist     Howard Harris is a 42 y.o. Male   .   Height: 6' (182.9 cm) Weight: 71.2 kg (156 lb 15.5 oz)   Indication: DVT   Goal INR: 2-3   Home dose: none   Risk factors or significant drug interactions: None   Other anticoagulants: Heparin infusion   Daily Monitoring     Date  INR  Warfarin dose  HGB  Notes   12/31  1.4  7.5 mg   11.2   1/1  1.2  5 mg    10.1   1/2  1.2  5mg     10.4  1/3 1.3    Consulted by Dr Wendi Maya on 08/07/12:   Will continue with 5mg  of warfarin and heparin drip infusing for bridging. Will continue to monitor.   Thanks,   [place], [place]   [phone]

## 2012-08-11 LAB — CBC WITH AUTOMATED DIFF
ABS. BASOPHILS: 0.1 10*3/uL (ref 0.0–0.2)
ABS. EOSINOPHILS: 0.6 10*3/uL (ref 0.0–0.8)
ABS. IMM. GRANS.: 0 10*3/uL (ref 0.0–0.5)
ABS. LYMPHOCYTES: 1.7 10*3/uL (ref 0.5–4.6)
ABS. MONOCYTES: 1.2 10*3/uL (ref 0.1–1.3)
ABS. NEUTROPHILS: 5.1 10*3/uL (ref 1.7–8.2)
BASOPHILS: 1 % (ref 0.0–2.0)
EOSINOPHILS: 7 % (ref 0.5–7.8)
HCT: 31.3 % — ABNORMAL LOW (ref 41.1–50.3)
HGB: 10.4 g/dL — ABNORMAL LOW (ref 13.6–17.2)
IMMATURE GRANULOCYTES: 0.2 % (ref 0.0–5.0)
LYMPHOCYTES: 20 % (ref 13–44)
MCH: 28.8 PG (ref 26.1–32.9)
MCHC: 33.2 g/dL (ref 31.4–35.0)
MCV: 86.7 FL (ref 79.6–97.8)
MONOCYTES: 14 % — ABNORMAL HIGH (ref 4.0–12.0)
MPV: 9.3 FL — ABNORMAL LOW (ref 10.8–14.1)
NEUTROPHILS: 58 % (ref 43–78)
PLATELET: 382 10*3/uL (ref 150–450)
RBC: 3.61 M/uL — ABNORMAL LOW (ref 4.23–5.67)
RDW: 13.9 % (ref 11.9–14.6)
WBC: 8.7 10*3/uL (ref 4.3–11.1)

## 2012-08-11 LAB — PROTHROMBIN TIME + INR
INR: 1.4 — ABNORMAL HIGH (ref 0.9–1.2)
Prothrombin time: 15.3 s — ABNORMAL HIGH (ref 9.6–11.6)

## 2012-08-11 MED ADMIN — sodium chloride (NS) flush 5-10 mL: INTRAVENOUS | @ 12:00:00 | NDC 87701099893

## 2012-08-11 MED ADMIN — sodium chloride (NS) flush 5-10 mL: INTRAVENOUS | @ 23:00:00 | NDC 87701099893

## 2012-08-11 MED ADMIN — warfarin (COUMADIN) tablet 7.5 mg: ORAL | @ 03:00:00 | NDC 00056017301

## 2012-08-11 MED ADMIN — sodium chloride (NS) flush 5-10 mL: INTRAVENOUS | @ 03:00:00 | NDC 87701099893

## 2012-08-11 MED ADMIN — enoxaparin (LOVENOX) injection 70 mg: SUBCUTANEOUS | @ 12:00:00 | NDC 00075801801

## 2012-08-11 MED ADMIN — enoxaparin (LOVENOX) injection 70 mg: SUBCUTANEOUS | @ 23:00:00 | NDC 00075801801

## 2012-08-11 MED FILL — WARFARIN 7.5 MG TAB: 7.5 mg | ORAL | Qty: 1

## 2012-08-11 MED FILL — LOVENOX 80 MG/0.8 ML SUBCUTANEOUS SYRINGE: 80 mg/0.8 mL | SUBCUTANEOUS | Qty: 0.8

## 2012-08-11 NOTE — Progress Notes (Signed)
RX: Warfarin dosing per pharmacist     Howard Harris is a 42 y.o. Male   .   Height: 6' (182.9 cm) Weight: 71.2 kg (156 lb 15.5 oz)   Indication: DVT   Goal INR: 2.5-3.5 per Dr. Wendi Maya  Home dose: none   Risk factors or significant drug interactions: None   Other anticoagulants: Lovenox tx dose    Daily Monitoring     Date  INR  Warfarin dose  HGB  Notes   12/31  1.4  7.5 mg   11.2   1/1  1.2  5 mg    10.1   1/2  1.2  5mg     10.4  1/3 1.3 7.5 mg   10.7  1/4 1.4 7.5 mg   10.4      Consulted by Dr Wendi Maya on 08/07/12:     MD increased to 7.5 mg last night.  Will continue this dose since goal INR is 2.5-3.5 and INR has not moved much.  Will continue to follow daily INRs.    Thanks,   Ihor Gully, PharmD, BCPS  Cardiology Clinical Pharmacist  [phone]

## 2012-08-11 NOTE — Progress Notes (Signed)
Hospitalist Progress Note    Subjective:   Daily Progress Note: 08/11/2012 1:39 PM    Pt doing well. Requests to restart his mood stabilizers ie Celexa and depakote. He has not been on them in years he says. He is aware INR going up and for now we have to keep him here for continued anticoagulation.     Current Facility-Administered Medications   Medication Dose Route Frequency   ??? [START ON 08/12/2012] citalopram (CELEXA) tablet 10 mg  10 mg Oral DAILY   ??? warfarin (COUMADIN) tablet 7.5 mg  7.5 mg Oral QHS   ??? enoxaparin (LOVENOX) injection 70 mg  1 mg/kg SubCUTAneous Q12H   ??? senna-docusate (PERICOLACE) 8.6-50 mg per tablet 1 Tab  1 Tab Oral BID PRN   ??? magnesium citrate solution 296 mL  296 mL Oral PRN   ??? acetaminophen (TYLENOL) tablet 650 mg  650 mg Oral Q6H PRN   ??? oxyCODONE-acetaminophen (PERCOCET 10) 10-325 mg per tablet 1 Tab  1 Tab Oral Q6H PRN   ??? nalOXone (NARCAN) injection 0.4 mg  0.4 mg IntraVENous PRN   ??? ibuprofen (MOTRIN) tablet 400 mg  400 mg Oral Q6H PRN   ??? [COMPLETED] mupirocin (BACTROBAN) 2 % ointment   Both Nostrils Q12H   ??? zolpidem (AMBIEN) tablet 5 mg  5 mg Oral QHS PRN   ??? sodium chloride (NS) flush 5-10 mL  5-10 mL IntraVENous Q8H   ??? sodium chloride (NS) flush 5-10 mL  5-10 mL IntraVENous PRN   ??? ondansetron (ZOFRAN) injection 4 mg  4 mg IntraVENous Q4H PRN        Review of Systems  Pertinent items are noted in HPI.    Objective:     BP 108/56   Pulse 76   Temp(Src) 98.6 ??F (37 ??C)   Resp 20   Ht 6' (1.829 m)   Wt 71.2 kg (156 lb 15.5 oz)   BMI 21.28 kg/m2   SpO2 95% O2 Flow Rate (L/min): 2 l/min O2 Device: Room air    Temp (24hrs), Avg:98.8 ??F (37.1 ??C), Min:98.6 ??F (37 ??C), Max:99.1 ??F (37.3 ??C)      01/04 0700 - 01/04 1859  In: 720 [P.O.:720]  Out: -   01/02 1900 - 01/04 0659  In: 880 [P.O.:880]  Out: 750 [Urine:750]    General appearance: alert, cooperative, no distress, appears stated age  Lungs: no acessory use  Neurologic: Mental status: Alert, oriented, thought content appropriate     Additional comments:I reviewed the patient's new clinical lab test results. INR    Data Review    Recent Results (from the past 24 hour(s))   PROTHROMBIN TIME    Collection Time    08/11/12  7:20 AM       Result Value Range    Prothrombin time 15.3 (*) 9.6 - 11.6 sec    INR 1.4 (*) 0.9 - 1.2     CBC WITH AUTOMATED DIFF    Collection Time    08/11/12  7:20 AM       Result Value Range    WBC 8.7  4.3 - 11.1 K/uL    RBC 3.61 (*) 4.23 - 5.67 M/uL    HGB 10.4 (*) 13.6 - 17.2 g/dL    HCT 24.4 (*) 01.0 - 50.3 %    MCV 86.7  79.6 - 97.8 FL    MCH 28.8  26.1 - 32.9 PG    MCHC 33.2  31.4 - 35.0 g/dL  RDW 13.9  11.9 - 14.6 %    PLATELET 382  150 - 450 K/uL    MPV 9.3 (*) 10.8 - 14.1 FL    DF AUTOMATED      NEUTROPHILS 58  43 - 78 %    LYMPHOCYTES 20  13 - 44 %    MONOCYTES 14 (*) 4.0 - 12.0 %    EOSINOPHILS 7  0.5 - 7.8 %    BASOPHILS 1  0.0 - 2.0 %    IMMATURE GRANULOCYTES 0.2  0.0 - 5.0 %    ABS. NEUTROPHILS 5.1  1.7 - 8.2 K/UL    ABS. LYMPHOCYTES 1.7  0.5 - 4.6 K/UL    ABS. MONOCYTES 1.2  0.1 - 1.3 K/UL    ABS. EOSINOPHILS 0.6  0.0 - 0.8 K/UL    ABS. BASOPHILS 0.1  0.0 - 0.2 K/UL    ABS. IMM. GRANS. 0.0  0.0 - 0.5 K/UL         Assessment/Plan:     Principal Problem:    DVT (deep venous thrombosis) (08/03/2012)    Active Problems:    Abnormal heart rate (08/03/2012)    on lovenox treatment dose , off heparin gtt to reduce sticks  Stay on coumadin 7.5 mg. INR creeping up.   Hopefully INR at goal by Monday and can dispo with follow up at [facility] PCP as he may have [facility] assistance already. SW called and asked to research with him.       Care Plan discussed with: Patient/Family and Nurse    Total time spent with patient: 20 minutes.

## 2012-08-12 LAB — CBC WITH AUTOMATED DIFF
ABS. BASOPHILS: 0.1 10*3/uL (ref 0.0–0.2)
ABS. EOSINOPHILS: 0.5 10*3/uL (ref 0.0–0.8)
ABS. IMM. GRANS.: 0 10*3/uL (ref 0.0–0.5)
ABS. LYMPHOCYTES: 2.3 10*3/uL (ref 0.5–4.6)
ABS. MONOCYTES: 1 10*3/uL (ref 0.1–1.3)
ABS. NEUTROPHILS: 5.2 10*3/uL (ref 1.7–8.2)
BASOPHILS: 1 % (ref 0.0–2.0)
EOSINOPHILS: 6 % (ref 0.5–7.8)
HCT: 33.6 % — ABNORMAL LOW (ref 41.1–50.3)
HGB: 11.1 g/dL — ABNORMAL LOW (ref 13.6–17.2)
IMMATURE GRANULOCYTES: 0.3 % (ref 0.0–5.0)
LYMPHOCYTES: 25 % (ref 13–44)
MCH: 29 PG (ref 26.1–32.9)
MCHC: 33 g/dL (ref 31.4–35.0)
MCV: 87.7 FL (ref 79.6–97.8)
MONOCYTES: 11 % (ref 4.0–12.0)
MPV: 9 FL — ABNORMAL LOW (ref 10.8–14.1)
NEUTROPHILS: 57 % (ref 43–78)
PLATELET: 440 10*3/uL (ref 150–450)
RBC: 3.83 M/uL — ABNORMAL LOW (ref 4.23–5.67)
RDW: 14.1 % (ref 11.9–14.6)
WBC: 9.1 10*3/uL (ref 4.3–11.1)

## 2012-08-12 LAB — PROTHROMBIN TIME + INR
INR: 1.8 — ABNORMAL HIGH (ref 0.9–1.2)
Prothrombin time: 19.9 s — ABNORMAL HIGH (ref 9.6–11.6)

## 2012-08-12 MED ADMIN — warfarin (COUMADIN) tablet 7.5 mg: ORAL | @ 02:00:00 | NDC 00056017301

## 2012-08-12 MED ADMIN — enoxaparin (LOVENOX) injection 70 mg: SUBCUTANEOUS | @ 12:00:00 | NDC 00075062280

## 2012-08-12 MED ADMIN — sodium chloride (NS) flush 5-10 mL: INTRAVENOUS | @ 02:00:00 | NDC 87701099893

## 2012-08-12 MED ADMIN — sodium chloride (NS) flush 5-10 mL: INTRAVENOUS | @ 12:00:00 | NDC 87701099893

## 2012-08-12 MED ADMIN — citalopram (CELEXA) tablet 10 mg: ORAL | @ 13:00:00 | NDC 00904608561

## 2012-08-12 MED ADMIN — sodium chloride (NS) flush 5-10 mL: INTRAVENOUS | @ 20:00:00 | NDC 87701099893

## 2012-08-12 MED ADMIN — enoxaparin (LOVENOX) injection 70 mg: SUBCUTANEOUS | @ 23:00:00 | NDC 00075801801

## 2012-08-12 MED FILL — CITALOPRAM 20 MG TAB: 20 mg | ORAL | Qty: 1

## 2012-08-12 MED FILL — LOVENOX 80 MG/0.8 ML SUBCUTANEOUS SYRINGE: 80 mg/0.8 mL | SUBCUTANEOUS | Qty: 0.8

## 2012-08-12 MED FILL — WARFARIN 7.5 MG TAB: 7.5 mg | ORAL | Qty: 1

## 2012-08-12 NOTE — Progress Notes (Signed)
Hospitalist Progress Note    Subjective:   Daily Progress Note: 08/12/2012 11:16 AM    Pt requests his mood stabilizers. Discussed PCP will need to help with this. INR up today so hopefully approachign discharge    Current Facility-Administered Medications   Medication Dose Route Frequency   ??? citalopram (CELEXA) tablet 10 mg  10 mg Oral DAILY   ??? warfarin (COUMADIN) tablet 7.5 mg  7.5 mg Oral QHS   ??? enoxaparin (LOVENOX) injection 70 mg  1 mg/kg SubCUTAneous Q12H   ??? senna-docusate (PERICOLACE) 8.6-50 mg per tablet 1 Tab  1 Tab Oral BID PRN   ??? magnesium citrate solution 296 mL  296 mL Oral PRN   ??? acetaminophen (TYLENOL) tablet 650 mg  650 mg Oral Q6H PRN   ??? oxyCODONE-acetaminophen (PERCOCET 10) 10-325 mg per tablet 1 Tab  1 Tab Oral Q6H PRN   ??? nalOXone (NARCAN) injection 0.4 mg  0.4 mg IntraVENous PRN   ??? ibuprofen (MOTRIN) tablet 400 mg  400 mg Oral Q6H PRN   ??? zolpidem (AMBIEN) tablet 5 mg  5 mg Oral QHS PRN   ??? sodium chloride (NS) flush 5-10 mL  5-10 mL IntraVENous Q8H   ??? sodium chloride (NS) flush 5-10 mL  5-10 mL IntraVENous PRN   ??? ondansetron (ZOFRAN) injection 4 mg  4 mg IntraVENous Q4H PRN        Review of Systems  Pertinent items are noted in HPI.    Objective:     BP 118/68   Pulse 80   Temp(Src) 98.8 ??F (37.1 ??C)   Resp 17   Ht 6' (1.829 m)   Wt 71.2 kg (156 lb 15.5 oz)   BMI 21.28 kg/m2   SpO2 91% O2 Flow Rate (L/min): 2 l/min O2 Device: Room air    Temp (24hrs), Avg:99 ??F (37.2 ??C), Min:98.6 ??F (37 ??C), Max:99.6 ??F (37.6 ??C)         01/03 1900 - 01/05 0659  In: 960 [P.O.:960]  Out: -     General appearance: alert, cooperative, no distress, appears stated age  Lungs: no acessory use  Neurologic: Mental status: Alert, oriented, thought content appropriate    Additional comments:I reviewed the patient's new clinical lab test results. hgb stable, inr    Data Review    Recent Results (from the past 24 hour(s))   PROTHROMBIN TIME    Collection Time    08/12/12  7:50 AM       Result Value Range     Prothrombin time 19.9 (*) 9.6 - 11.6 sec    INR 1.8 (*) 0.9 - 1.2     CBC WITH AUTOMATED DIFF    Collection Time    08/12/12  7:50 AM       Result Value Range    WBC 9.1  4.3 - 11.1 K/uL    RBC 3.83 (*) 4.23 - 5.67 M/uL    HGB 11.1 (*) 13.6 - 17.2 g/dL    HCT 16.1 (*) 09.6 - 50.3 %    MCV 87.7  79.6 - 97.8 FL    MCH 29.0  26.1 - 32.9 PG    MCHC 33.0  31.4 - 35.0 g/dL    RDW 04.5  40.9 - 81.1 %    PLATELET 440  150 - 450 K/uL    MPV 9.0 (*) 10.8 - 14.1 FL    DF AUTOMATED      NEUTROPHILS 57  43 - 78 %  LYMPHOCYTES 25  13 - 44 %    MONOCYTES 11  4.0 - 12.0 %    EOSINOPHILS 6  0.5 - 7.8 %    BASOPHILS 1  0.0 - 2.0 %    IMMATURE GRANULOCYTES 0.3  0.0 - 5.0 %    ABS. NEUTROPHILS 5.2  1.7 - 8.2 K/UL    ABS. LYMPHOCYTES 2.3  0.5 - 4.6 K/UL    ABS. MONOCYTES 1.0  0.1 - 1.3 K/UL    ABS. EOSINOPHILS 0.5  0.0 - 0.8 K/UL    ABS. BASOPHILS 0.1  0.0 - 0.2 K/UL    ABS. IMM. GRANS. 0.0  0.0 - 0.5 K/UL         Assessment/Plan:     Principal Problem:    DVT (deep venous thrombosis) (08/03/2012)    Active Problems:    Abnormal heart rate (08/03/2012)      Continue coumadin at current dose  lovenox on as well  Goal INR per Wendi Maya is actually 2.5 to 3.5  Possibly home in am if INR better.   Plan is outpt follow up with [facility] PCP so he can have coverage under [place] financial assistance program.     Care Plan discussed with: Patient/Family    Total time spent with patient: 20 minutes.

## 2012-08-12 NOTE — Progress Notes (Signed)
RX: Warfarin dosing per pharmacist     Howard Harris is a 42 y.o. Male   .   Height: 6' (182.9 cm) Weight: 71.2 kg (156 lb 15.5 oz)   Indication: DVT   Goal INR: 2.5-3.5 per Dr. Wendi Maya  Home dose: none   Risk factors or significant drug interactions: None   Other anticoagulants: Lovenox tx dose    Daily Monitoring     Date  INR  Warfarin dose  HGB  Notes   12/31  1.4  7.5 mg   11.2   1/1  1.2  5 mg    10.1   1/2  1.2  5mg     10.4  1/3 1.3 7.5 mg   10.7  1/4 1.4 7.5 mg   10.4    1/5 1.8 7.5 mg   11.1      Consulted by Dr Wendi Maya on 08/07/12:     INR increasing appropriately.  Will continue current dose.  Will continue to follow daily INRs.    Thanks,   Ihor Gully, PharmD, BCPS  Cardiology Clinical Pharmacist  [phone]

## 2012-08-13 LAB — PROTHROMBIN TIME + INR
INR: 2 — ABNORMAL HIGH (ref 0.9–1.2)
Prothrombin time: 21.8 s — ABNORMAL HIGH (ref 9.6–11.6)

## 2012-08-13 MED ADMIN — citalopram (CELEXA) tablet 10 mg: ORAL | @ 14:00:00 | NDC 00904608561

## 2012-08-13 MED ADMIN — sodium chloride (NS) flush 5-10 mL: INTRAVENOUS | @ 02:00:00 | NDC 87701099893

## 2012-08-13 MED ADMIN — enoxaparin (LOVENOX) injection 70 mg: SUBCUTANEOUS | @ 11:00:00 | NDC 00075801801

## 2012-08-13 MED ADMIN — warfarin (COUMADIN) tablet 7.5 mg: ORAL | @ 02:00:00 | NDC 00056017301

## 2012-08-13 MED ADMIN — sodium chloride (NS) flush 5-10 mL: INTRAVENOUS | @ 11:00:00 | NDC 87701099893

## 2012-08-13 MED FILL — IBUPROFEN 400 MG TAB: 400 mg | ORAL | Qty: 1

## 2012-08-13 MED FILL — CITALOPRAM 20 MG TAB: 20 mg | ORAL | Qty: 1

## 2012-08-13 MED FILL — LOVENOX 80 MG/0.8 ML SUBCUTANEOUS SYRINGE: 80 mg/0.8 mL | SUBCUTANEOUS | Qty: 0.8

## 2012-08-13 MED FILL — WARFARIN 7.5 MG TAB: 7.5 mg | ORAL | Qty: 1

## 2012-08-13 NOTE — Discharge Summary (Addendum)
Physician Discharge Summary     Patient ID:  Howard Harris  [id number]  41 y.o.  1971/05/06    Admit date: 08/03/2012    Discharge date and time: 08/13/2012    Admission Diagnoses: DVT (deep venous thrombosis)    Discharge Diagnoses:  Principal Diagnosis DVT (deep venous thrombosis)                                            Principal Problem:    DVT (deep venous thrombosis) (08/03/2012)    Active Problems:    Abnormal heart rate (08/03/2012)       Hospital Course:   Howard Harris is a 42 y.o. black male w/ PMH of HTN presents with leg pain. Pt reportedly was stretching approx a week ago and felt like he injured the back of his knee. Since that time he has had decreased mobility and noticed that the pain was extending from back of right knee up into thigh area. He describes the pain as excruciating throughout entire thigh. He denies any shortness of breath, fever, chills, cp, n/v/diarrhea/ or urinary symptoms. No discharge.       Pt admitted with right groin and leg pain found to have extensive DVT. Risk factors include possible leg injury from work out, but pt does not smoke, no recent sedentary status. No family hx of clot. This appears to be unprovoked DVT and therefore will deserve hypercoag work up out pt once he is off Coumadin and acute DVT has resolved. He underwent EKOS thrombolysis by [facility] of Interventional Radiology.     He remained here for 10 days on Heparin gtt and coumadin awaiting therapeutic INR. We attempted conversion to lovenox bridge but due to lack of funding and lack of insurance and no established PCP to follow, he stayed here until INR 2.0.   Goal INR per [facility] is 2.5 to 3.5 . He has done well on Coumadin 7. 5 mg nightly. He will need INR this week with PCP.   He supposedly already has [employer] and "SNAP" benefits which will allow him to go to [employer].  We have discussed other options for PCP including [facility].     PCP: to be  established, Follow with Dr Launa Flight of [facility] or Dr Hale Bogus depending on status of financial assistance    Consults: Interventional Radiology, Hospitalist    Significant Diagnostic Studies:   [place] - acute DVT    Discharge Exam:  BP 122/76   Pulse 76   Temp(Src) 98.7 ??F (37.1 ??C)   Resp 17   Ht 6' (1.829 m)   Wt 71.2 kg (156 lb 15.5 oz)   BMI 21.28 kg/m2   SpO2 95%  General appearance: alert, cooperative, no distress, appears stated age  Lungs: no acessory use  Legs- no edema  Neurologic: Mental status: Alert, oriented, thought content appropriate    Disposition: home    Patient Instructions:   Current Discharge Medication List      START taking these medications    Details   citalopram (CELEXA) 10 mg tablet Take 1 Tab by mouth daily.  Qty: 30 Tab, Refills: 2      warfarin (COUMADIN) 7.5 mg tablet Take 1 Tab by mouth nightly. Indications: DEEP VENOUS THROMBOSIS  Qty: 30 Tab, Refills: 2  Activity: activity as tolerated  Diet: Coumadin Diet  Wound Care: None needed    Follow-up with PCP , Dr Launa Flight if [facility] Financial assistance goes through or Dr Hale Bogus if cannot establish this week  Willl need hypercoag work up ie Factor V leiden, Gene mutations, Protein C and S, SLE anticoagulant  INR this week and PRN for at least 3 months of treatment for DVT    Then 3 months with [facility] or IR to remove IVC filter     Signed:  Richardson Dopp Lacorey Brusca, DO  08/13/2012  11:31 AM  > 30 min in discharge planning and coordination of care

## 2012-08-13 NOTE — Progress Notes (Signed)
Received request that patient wanted to speak with someone regarding conversion to Catholicism. Patient states he has been exploring a number of religious traditions, the latest being Messianic Judaism. Provided active listening for patient; will discuss with Fr. Clinton Quant hopefully prior to patient's discharge and follow up with patient.  [name], M.Div  Chaplain

## 2012-08-13 NOTE — Progress Notes (Signed)
Conferred with Fr. Clinton Quant; met again with patient. Provided active listening and referral to local parish. Assured him of continued prayers. Will follow as needed.  [name], M.Div  Chaplain

## 2012-08-13 NOTE — Progress Notes (Signed)
Patient discharged home.  Signed hard copy of discharge instruction.  Verbalized understanding.

## 2012-08-13 NOTE — Progress Notes (Signed)
Triple lumen CVC removed for discharge.  Patient followed instructions for turning head to left side and holding breath while being removed.  No bleeding noted.  Triple antibiotic cream applied with pressure dressing.  Tolerated well.

## 2012-08-14 NOTE — Progress Notes (Signed)
LATE ENTRY FROM 08/13/12  Pt medically cleared for discharge to home today.  Pt successfully bridged to coumadin therapy so voucher for Lovenox not needed.  Pt has follow up appt scheduled with his PCP so referral to [facility] not needed.  No other discharge needs or concerns identified or reported.

## 2012-08-16 LAB — PROTHROMBIN TIME + INR
INR: 1.2 (ref 0.9–1.2)
Prothrombin time: 12.7 s — ABNORMAL HIGH (ref 9.6–11.6)

## 2012-08-16 MED ADMIN — warfarin (COUMADIN) tablet 7.5 mg: ORAL | @ 23:00:00 | NDC 00056017201

## 2012-08-16 MED ADMIN — enoxaparin (LOVENOX) injection 100 mg: SUBCUTANEOUS | @ 23:00:00 | NDC 00075062300

## 2012-08-16 MED FILL — LOVENOX 100 MG/ML SUBCUTANEOUS SYRINGE: 100 mg/mL | SUBCUTANEOUS | Qty: 1

## 2012-08-16 MED FILL — WARFARIN 5 MG TAB: 5 mg | ORAL | Qty: 2

## 2012-08-16 NOTE — ED Notes (Signed)
Pt has been diagnosed with blood clot on 08/03/2012 here/ states he has not been able to get meds in last 2 days and is requesting re-admission

## 2012-08-16 NOTE — ED Notes (Signed)
States he does not have the money to get coumadin filled

## 2012-08-16 NOTE — ED Notes (Signed)
Pt discharged home with Rx x2 and follow up with [facility] at [facility].  Pt verbalized understanding of all instructions.  All belongings with pt.  Pt ambulatory w/o difficulty.  Pt to lobby to wait for cab.

## 2012-08-16 NOTE — ED Notes (Signed)
Pt to ED c/o not being able to afford coumadin rx.  States he was dx with blood clot to right knee/groin and was hospitalized.  States last dose of coumadin was when he was discharged from hospital a few days ago.  No new complaints, only that he can't afford Rx.
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
## 2012-08-16 NOTE — Progress Notes (Signed)
Patient released from hospital on 08/13/12 after extensive DVT and EKOS thrombolysis by interventional radiology- left the hospital with RX for Coumadin and to FU with his PCP BUT patient failed to mention that he was homeless- showed up at [facility] wanting financial assistance with Rxs- no Coumadin since left hospital - was sent to ER - discharge summary shows patient bridged to coumadin while in hospital but needs extensive hypercoag WU as OP- patient states he did not want to bother anyone and be a burden and so did not share his circumstances- discussed with ER provider - will sign in for ER visit to check INR and need for Lovonox at this point - will work on transition to community

## 2012-08-16 NOTE — ED Provider Notes (Signed)
HPI Comments: 20 homeless bm recently diagnosed and treated for a dvt in the right leg released 2 days ago and could not find shelter, did not fill his coumadin and is here for help. He denies chest pain or sob. He has a filter. i discussed with our Child psychotherapist and she has a ride and a place to stay tonight and wants [place] to give him lovenox and coumadin and she will get him a permanent place to stay and get his meds filled        Past Medical History   Diagnosis Date   ??? Hypertension    ??? DVT (deep venous thrombosis) 12/13     had EKOS and lysis, IVC         Past Surgical History   Procedure Laterality Date   ??? Hx vascular access       IVC filter for DVT         Family History   Problem Relation Age of Onset   ??? Hypertension          History     Social History   ??? Marital Status: MARRIED     Spouse Name: N/A     Number of Children: N/A   ??? Years of Education: N/A     Occupational History   ??? Not on file.     Social History Main Topics   ??? Smoking status: Former Smoker     Quit date: 11/07/2011   ??? Smokeless tobacco: Never Used   ??? Alcohol Use: No   ??? Drug Use: No   ??? Sexually Active: Not on file     Other Topics Concern   ??? Not on file     Social History Narrative   ??? No narrative on file                  ALLERGIES: Review of patient's allergies indicates no known allergies.      Review of Systems   Constitutional: Negative for fever and fatigue.   Respiratory: Negative for cough, shortness of breath, wheezing and stridor.    Cardiovascular: Negative for chest pain and leg swelling.   Gastrointestinal: Negative for nausea, vomiting, abdominal pain and constipation.   Genitourinary: Negative for dysuria.   Musculoskeletal: Negative for back pain.   Skin: Negative for rash.   All other systems reviewed and are negative.        Filed Vitals:    08/16/12 1237   BP: 110/75   Pulse: 106   Temp: 98.4 ??F (36.9 ??C)   Resp: 16   Height: 6\' 1"  (1.854 m)   Weight: 68.04 kg (150 lb)   SpO2: 100%            Physical Exam    Nursing note and vitals reviewed.  Constitutional: He is oriented to person, place, and time. He appears well-developed and well-nourished. No distress.   HENT:   Head: Normocephalic and atraumatic.   Right Ear: External ear normal.   Left Ear: External ear normal.   Nose: Nose normal.   Mouth/Throat: Oropharynx is clear and moist. No oropharyngeal exudate.   Eyes: Conjunctivae and EOM are normal. Pupils are equal, round, and reactive to light. Right eye exhibits no discharge. Left eye exhibits no discharge.   Neck: Normal range of motion. Neck supple. No thyromegaly present.   Cardiovascular: Normal rate, regular rhythm, normal heart sounds and intact distal pulses.  Exam reveals no gallop and no friction rub.  No murmur heard.  Pulmonary/Chest: Effort normal and breath sounds normal. No respiratory distress. He has no wheezes. He has no rales. He exhibits no tenderness.   Abdominal: Soft. Bowel sounds are normal. There is no tenderness. There is no rebound and no guarding.   Musculoskeletal: Normal range of motion. He exhibits no edema and no tenderness.   Neurological: He is alert and oriented to person, place, and time. No cranial nerve deficit. Coordination normal.   Skin: Skin is warm. No rash noted.        MDM    Procedures

## 2012-08-16 NOTE — Progress Notes (Signed)
Discussed after MD eval - will give a 1 time dose of Lovonox tonight and his Coumadin 7.5mg -  Has a filter so Md felt his INR would come up soon and not need further Lovonox- will re-write his Coumadin and Celexa RX to take to [facility] in AM- pt arranged to stay with a friend on [address] and will be close enough to walk to [facility] for the [employer]- packet with Er summary and hospital discharge notes to take with him pt voiced understanding of plan

## 2012-08-22 MED ADMIN — ketorolac tromethamine (TORADOL) 60 mg/2 mL injection 60 mg: INTRAMUSCULAR | @ 12:00:00 | NDC 00409379601

## 2012-08-22 MED FILL — KETOROLAC TROMETHAMINE 60 MG/2 ML IM: 60 mg/2 mL | INTRAMUSCULAR | Qty: 2

## 2012-08-22 NOTE — ED Provider Notes (Signed)
HPI Comments: Pt. With right DVT.  Admitted when first diagnosed.  Problems with coumadin once out and [facility] but now taking coumadin for 5 days. Has appt. With [facility] for f/u. Has a greenfield filter in place. Here just for pain.    Patient is a 42 y.o. male presenting with lower extremity edema. The history is provided by the patient. No language interpreter was used.   Leg Swelling   This is a chronic problem. The current episode started more than 1 week ago. The problem occurs constantly. The problem has not changed since onset.The pain is present in the right upper leg. The quality of the pain is described as aching. The pain is moderate. Pertinent negatives include no numbness, full range of motion, no back pain and no neck pain. The symptoms are aggravated by palpation and movement. He has tried nothing for the symptoms. There has been no history of extremity trauma.        Past Medical History   Diagnosis Date   ??? Hypertension    ??? DVT (deep venous thrombosis) 12/13     had EKOS and lysis, IVC         Past Surgical History   Procedure Laterality Date   ??? Hx vascular access       IVC filter for DVT   ??? Hx other surgical       greenfield filter         Family History   Problem Relation Age of Onset   ??? Hypertension          History     Social History   ??? Marital Status: MARRIED     Spouse Name: N/A     Number of Children: N/A   ??? Years of Education: N/A     Occupational History   ??? Not on file.     Social History Main Topics   ??? Smoking status: Former Smoker     Quit date: 11/07/2011   ??? Smokeless tobacco: Never Used   ??? Alcohol Use: No   ??? Drug Use: No   ??? Sexually Active: Not on file     Other Topics Concern   ??? Not on file     Social History Narrative   ??? No narrative on file                  ALLERGIES: Review of patient's allergies indicates no known allergies.      Review of Systems   Constitutional: Negative for fever and chills.   HENT: Negative for neck pain and neck stiffness.    Eyes:  Negative for pain and redness.   Respiratory: Negative for chest tightness, shortness of breath and wheezing.    Cardiovascular: Negative for chest pain and leg swelling.   Gastrointestinal: Negative for nausea, vomiting, abdominal pain and diarrhea.   Genitourinary: Negative for dysuria and hematuria.   Musculoskeletal: Negative for back pain and gait problem.   Skin: Negative for color change and rash.   Neurological: Negative for weakness, numbness and headaches.       Filed Vitals:    08/22/12 0448   BP: 112/56   Pulse: 102   Temp: 98.3 ??F (36.8 ??C)   Resp: 16   Height: 6' (1.829 m)   Weight: 68.04 kg (150 lb)   SpO2: 97%            Physical Exam   Nursing note and vitals reviewed.  Constitutional: He  is oriented to person, place, and time. He appears well-developed and well-nourished.   HENT:   Head: Normocephalic and atraumatic.   Neck: Normal range of motion. Neck supple.   Cardiovascular: Normal rate and regular rhythm.    No murmur heard.  Pulmonary/Chest: Effort normal and breath sounds normal. He has no wheezes.   Abdominal: Soft. Bowel sounds are normal. There is no tenderness.   Musculoskeletal: Normal range of motion. He exhibits no edema.   RLE with palpable cord behind knee and inner thigh. Mild swelling. TTP. No erythema. Distal sensation and pulse intact.    Neurological: He is alert and oriented to person, place, and time.   Skin: Skin is warm and dry.        MDM    Procedures

## 2012-08-22 NOTE — ED Notes (Signed)
Pt given d/c instructions. Pt requesting assistance with a ride home. Explained to pt to please wait and coordinator would speak with him. Attempted to locate pt for med administration and for coordinator to speak with pt and unable to locate

## 2012-08-22 NOTE — ED Notes (Signed)
Patient waiting for shot time for discharge from the department.

## 2012-08-22 NOTE — ED Notes (Signed)
Pt reports swelling behind his right knee and up into groin. Pt has history of DVT in right leg. Pt is on coumadin for this clot as well. Pt reports pain and swelling to right leg and groin. Pt sts the pain has worsened over the past 2 days

## 2012-08-25 LAB — PROTHROMBIN TIME + INR
INR: 3.1 — ABNORMAL HIGH (ref 0.9–1.2)
Prothrombin time: 33.8 s — ABNORMAL HIGH (ref 9.6–11.6)

## 2012-08-25 NOTE — ED Provider Notes (Signed)
HPI Comments: Pt with hx of DVT in RLE with thombolysis, thrombectomy and IVC filter placement while hospitalized, presents complaining of leg swelling and discomfort.  He reports compliance with coumadin, but hasn't been able to obtain any compression stockings.     Patient is a 42 y.o. male presenting with leg pain. The history is provided by the patient.   Leg Pain   This is a recurrent problem. The current episode started 2 days ago. The problem occurs constantly. The problem has not changed since onset.Pain location: right leg and foot. The pain is at a severity of 7/10. The pain is moderate. Pertinent negatives include no numbness, full range of motion, no stiffness, no tingling, no itching, no back pain and no neck pain. The symptoms are aggravated by standing and movement. He has tried nothing for the symptoms. The treatment provided no relief. There has been no history of extremity trauma.        Past Medical History   Diagnosis Date   ??? Hypertension    ??? DVT (deep venous thrombosis) 12/13     had EKOS and lysis, IVC         Past Surgical History   Procedure Laterality Date   ??? Hx vascular access       IVC filter for DVT   ??? Hx other surgical       greenfield filter         Family History   Problem Relation Age of Onset   ??? Hypertension          History     Social History   ??? Marital Status: SINGLE     Spouse Name: N/A     Number of Children: N/A   ??? Years of Education: N/A     Occupational History   ??? Not on file.     Social History Main Topics   ??? Smoking status: Former Smoker     Quit date: 11/07/2011   ??? Smokeless tobacco: Never Used   ??? Alcohol Use: No   ??? Drug Use: No   ??? Sexually Active: Not on file     Other Topics Concern   ??? Not on file     Social History Narrative   ??? No narrative on file                  ALLERGIES: Review of patient's allergies indicates no known allergies.      Review of Systems   HENT: Negative for neck pain.    Musculoskeletal: Negative for back pain and stiffness.   Skin:  Negative for itching.   Neurological: Negative for tingling and numbness.   All other systems reviewed and are negative.        Filed Vitals:    08/25/12 1438   BP: 127/87   Pulse: 95   Temp: 98.6 ??F (37 ??C)   Resp: 16   Height: 6' (1.829 m)   Weight: 68.04 kg (150 lb)   SpO2: 99%            Physical Exam   Nursing note and vitals reviewed.  Constitutional: He is oriented to person, place, and time. He appears well-developed and well-nourished. No distress.   HENT:   Head: Normocephalic.   Eyes: Conjunctivae and EOM are normal. Pupils are equal, round, and reactive to light.   Musculoskeletal: Normal range of motion. He exhibits edema and tenderness.   RLE edema; positive pulses   Neurological: He is  alert and oriented to person, place, and time.   Skin: Skin is warm and dry.   Psychiatric: He has a normal mood and affect. His behavior is normal.        MDM     Amount and/or Complexity of Data Reviewed:   Clinical lab tests:  Ordered and reviewed   Decide to obtain previous medical records or to obtain history from someone other than the patient:  Yes  Risk of Significant Complications, Morbidity, and/or Mortality:   Presenting problems:  Low  Diagnostic procedures:  Low  Management options:  Low  Progress:   Patient progress:  Stable      Procedures

## 2012-08-25 NOTE — ED Notes (Signed)
Blood drawn via butterfly ( R AC ) & sent to lab

## 2012-08-25 NOTE — ED Notes (Signed)
Therapeutic INR.  Provided TED stockings and instructed pt on elevation of leg for edema

## 2012-08-25 NOTE — ED Notes (Signed)
Pt st leg swelling and pain to right leg. Pt st he has DVT

## 2012-08-25 NOTE — ED Notes (Signed)
PT wearing ted hose as ordered

## 2012-08-25 NOTE — ED Notes (Signed)
Copy of discharge instructions given to patient. Denies any questions at this time.

## 2012-09-12 LAB — METABOLIC PANEL, COMPREHENSIVE
A-G Ratio: 0.8 — ABNORMAL LOW (ref 1.2–3.5)
ALT (SGPT): 24 U/L (ref 12–65)
AST (SGOT): 21 U/L (ref 15–37)
Albumin: 3.7 g/dL (ref 3.5–5.0)
Alk. phosphatase: 85 U/L (ref 50–136)
Anion gap: 6 mmol/L — ABNORMAL LOW (ref 7–16)
BUN: 11 MG/DL (ref 6–23)
Bilirubin, total: 0.2 MG/DL (ref 0.2–1.1)
CO2: 28 MMOL/L (ref 21–32)
Calcium: 9.2 MG/DL (ref 8.3–10.4)
Chloride: 106 MMOL/L (ref 98–107)
Creatinine: 1.21 MG/DL (ref 0.8–1.5)
GFR est AA: >60 mL/min/{1.73_m2} (ref >60)
GFR est non-AA: >60 mL/min/{1.73_m2} (ref >60)
Globulin: 4.7 g/dL — ABNORMAL HIGH (ref 2.3–3.5)
Glucose: 81 MG/DL (ref 65–100)
Potassium: 4.1 MMOL/L (ref 3.5–5.1)
Protein, total: 8.4 g/dL — ABNORMAL HIGH (ref 6.3–8.2)
Sodium: 140 MMOL/L (ref 136–145)

## 2012-09-12 LAB — CBC WITH AUTOMATED DIFF
ABS. BASOPHILS: 0 10*3/uL (ref 0.0–0.2)
ABS. EOSINOPHILS: 0.1 10*3/uL (ref 0.0–0.8)
ABS. IMM. GRANS.: 0 10*3/uL (ref 0.0–0.5)
ABS. LYMPHOCYTES: 1.7 10*3/uL (ref 0.5–4.6)
ABS. MONOCYTES: 0.4 10*3/uL (ref 0.1–1.3)
ABS. NEUTROPHILS: 3.5 10*3/uL (ref 1.7–8.2)
BASOPHILS: 0 % (ref 0.0–2.0)
EOSINOPHILS: 2 % (ref 0.5–7.8)
HCT: 38.8 % — ABNORMAL LOW (ref 41.1–50.3)
HGB: 12.4 g/dL — ABNORMAL LOW (ref 13.6–17.2)
IMMATURE GRANULOCYTES: 0.2 % (ref 0.0–5.0)
LYMPHOCYTES: 29 % (ref 13–44)
MCH: 28.4 PG (ref 26.1–32.9)
MCHC: 32 g/dL (ref 31.4–35.0)
MCV: 88.8 FL (ref 79.6–97.8)
MONOCYTES: 7 % (ref 4.0–12.0)
MPV: 9.4 FL — ABNORMAL LOW (ref 10.8–14.1)
NEUTROPHILS: 62 % (ref 43–78)
PLATELET: 290 10*3/uL (ref 150–450)
RBC: 4.37 M/uL (ref 4.23–5.67)
RDW: 14.5 % (ref 11.9–14.6)
WBC: 5.8 10*3/uL (ref 4.3–11.1)

## 2012-09-12 LAB — MAGNESIUM: Magnesium: 1.8 MG/DL (ref 1.8–2.4)

## 2012-09-12 NOTE — ED Notes (Signed)
Has not taken celexa for a few days

## 2012-09-12 NOTE — Progress Notes (Addendum)
Spiritual Care Visit, in the Emergency Department.    Visit, prayer, with patient in ED.     Listened as patient spoke of his illness which brought him to the hospital.    Signed by Demetrios Isaacs. Starla Link., Staff Chaplain

## 2012-09-12 NOTE — ED Notes (Signed)
I have reviewed discharge instructions with the patient.  The patient verbalized understanding. Esgin is not working.

## 2012-09-12 NOTE — ED Provider Notes (Signed)
Patient is a 42 y.o. male presenting with fatigue. The history is provided by the patient.   Fatigue  This is a new problem. The current episode started 3 to 5 hours ago. The problem has been resolved. There was left facial and right facial focality noted. Pertinent negatives include no focal weakness, no memory loss, no movement disorder, no agitation, no visual change, no auditory change, no mental status change and no disorientation. There has been no fever. Pertinent negatives include no vomiting, no confusion, no headaches, no choking, no nausea, no bowel incontinence and no bladder incontinence. Associated medical issues do not include mood changes, seizures or dementia.        Past Medical History   Diagnosis Date   ??? Hypertension    ??? DVT (deep venous thrombosis) 12/13     had EKOS and lysis, IVC         Past Surgical History   Procedure Laterality Date   ??? Hx vascular access       IVC filter for DVT   ??? Hx other surgical       greenfield filter         Family History   Problem Relation Age of Onset   ??? Hypertension          History     Social History   ??? Marital Status: SINGLE     Spouse Name: N/A     Number of Children: N/A   ??? Years of Education: N/A     Occupational History   ??? Not on file.     Social History Main Topics   ??? Smoking status: Former Smoker     Quit date: 11/07/2011   ??? Smokeless tobacco: Never Used   ??? Alcohol Use: No   ??? Drug Use: No   ??? Sexually Active: Not on file     Other Topics Concern   ??? Not on file     Social History Narrative   ??? No narrative on file                  ALLERGIES: Review of patient's allergies indicates no known allergies.      Review of Systems   Constitutional: Positive for fatigue. Negative for chills and diaphoresis.   HENT: Negative for neck pain and neck stiffness.    Eyes: Negative for photophobia and visual disturbance.   Respiratory: Negative for choking and chest tightness.    Cardiovascular: Negative for palpitations and leg swelling.   Gastrointestinal:  Negative for nausea, vomiting, abdominal pain and bowel incontinence.   Endocrine: Negative for polyphagia and polyuria.   Genitourinary: Negative for bladder incontinence, dysuria and flank pain.   Musculoskeletal: Negative for back pain and gait problem.   Skin: Negative for pallor and rash.   Allergic/Immunologic: Negative for food allergies and immunocompromised state.   Neurological: Negative for focal weakness, light-headedness and headaches.   Hematological: Negative for adenopathy. Does not bruise/bleed easily.   Psychiatric/Behavioral: Negative for memory loss, confusion and agitation.       Filed Vitals:    09/12/12 1308   BP: 110/59   Pulse: 64   Temp: 98.1 ??F (36.7 ??C)   Resp: 15   Height: 6' (1.829 m)   Weight: 71.215 kg (157 lb)   SpO2: 100%            Physical Exam   Nursing note and vitals reviewed.  Constitutional: He is oriented to person, place, and time. He  appears well-developed and well-nourished. No distress.   HENT:   Head: Normocephalic and atraumatic.   Mouth/Throat: Oropharynx is clear and moist. No oropharyngeal exudate.   Eyes: Conjunctivae and EOM are normal. Pupils are equal, round, and reactive to light. No scleral icterus.   Neck: Normal range of motion. Neck supple. No JVD present. No tracheal deviation present. No thyromegaly present.   Cardiovascular: Normal rate, normal heart sounds and intact distal pulses.  Exam reveals no gallop and no friction rub.    No murmur heard.  Pulmonary/Chest: Effort normal and breath sounds normal. No respiratory distress. He has no wheezes. He has no rales. He exhibits no tenderness.   Abdominal: Soft. Bowel sounds are normal. He exhibits no distension and no mass. There is no tenderness. There is no rebound and no guarding.   Musculoskeletal: Normal range of motion. He exhibits no edema and no tenderness.   Lymphadenopathy:     He has no cervical adenopathy.   Neurological: He is alert and oriented to person, place, and time. He has normal  reflexes. He displays normal reflexes. No cranial nerve deficit. He exhibits normal muscle tone. Coordination normal.   Skin: Skin is warm and dry. No rash noted. He is not diaphoretic. No erythema. No pallor.   Psychiatric: He has a normal mood and affect. His behavior is normal. Judgment and thought content normal.        MDM     Differential Diagnosis; Clinical Impression; Plan:     42 yo AAM presents after he felt some tingling in his face today.  He denies any other symptoms and is neurologically intact.  I have a low suspicion for any emergent pathology.  Amount and/or Complexity of Data Reviewed:   Clinical lab tests:  Ordered and reviewed  Risk of Significant Complications, Morbidity, and/or Mortality:   Presenting problems:  Low  Diagnostic procedures:  Low  Management options:  Low  Progress:   Patient progress:  Stable      Procedures    Results Include:    Recent Results (from the past 24 hour(s))   CBC WITH AUTOMATED DIFF    Collection Time     09/12/12  2:20 PM       Result Value Range    WBC 5.8  4.3 - 11.1 K/uL    RBC 4.37  4.23 - 5.67 M/uL    HGB 12.4 (*) 13.6 - 17.2 g/dL    HCT 96.0 (*) 45.4 - 50.3 %    MCV 88.8  79.6 - 97.8 FL    MCH 28.4  26.1 - 32.9 PG    MCHC 32.0  31.4 - 35.0 g/dL    RDW 09.8  11.9 - 14.7 %    PLATELET 290  150 - 450 K/uL    MPV 9.4 (*) 10.8 - 14.1 FL    DF AUTOMATED      NEUTROPHILS 62  43 - 78 %    LYMPHOCYTES 29  13 - 44 %    MONOCYTES 7  4.0 - 12.0 %    EOSINOPHILS 2  0.5 - 7.8 %    BASOPHILS 0  0.0 - 2.0 %    IMMATURE GRANULOCYTES 0.2  0.0 - 5.0 %    ABS. NEUTROPHILS 3.5  1.7 - 8.2 K/UL    ABS. LYMPHOCYTES 1.7  0.5 - 4.6 K/UL    ABS. MONOCYTES 0.4  0.1 - 1.3 K/UL    ABS. EOSINOPHILS 0.1  0.0 - 0.8 K/UL  ABS. BASOPHILS 0.0  0.0 - 0.2 K/UL    ABS. IMM. GRANS. 0.0  0.0 - 0.5 K/UL   METABOLIC PANEL, COMPREHENSIVE    Collection Time     09/12/12  2:20 PM       Result Value Range    Sodium 140  136 - 145 MMOL/L    Potassium 4.1  3.5 - 5.1 MMOL/L    Chloride 106  98 - 107  MMOL/L    CO2 28  21 - 32 MMOL/L    Anion gap 6 (*) 7 - 16 mmol/L    Glucose 81  65 - 100 MG/DL    BUN 11  6 - 23 MG/DL    Creatinine 1.61  0.8 - 1.5 MG/DL    GFR est AA >09  >60 ml/min/1.41m2    GFR est non-AA >60  >60 ml/min/1.56m2    Calcium 9.2  8.3 - 10.4 MG/DL    Bilirubin, total 0.2  0.2 - 1.1 MG/DL    ALT 24  12 - 65 U/L    AST 21  15 - 37 U/L    Alk. phosphatase 85  50 - 136 U/L    Protein, total 8.4 (*) 6.3 - 8.2 g/dL    Albumin 3.7  3.5 - 5.0 g/dL    Globulin 4.7 (*) 2.3 - 3.5 g/dL    A-G Ratio 0.8 (*) 1.2 - 3.5     MAGNESIUM    Collection Time     09/12/12  2:20 PM       Result Value Range    Magnesium 1.8  1.8 - 2.4 MG/DL

## 2012-09-12 NOTE — ED Notes (Signed)
States he was walking down the street to get to [facility] aid and started feeling dizzy and felt bump in mouth and states his forehead was feeling numb/ sore throat

## 2012-09-12 NOTE — Progress Notes (Signed)
Had met with patient on 08/16/12 and arranged fu at [facility] - has been maintained at therapeutic INR since then and has his Coumadin to take - is also receiving community case management with [facility] at [facility] and has appoint with [facility] next week - looks healthy and has gained some weight-

## 2012-09-13 ENCOUNTER — Encounter

## 2012-09-13 LAB — METABOLIC PANEL, BASIC
Anion gap: 5 mmol/L — ABNORMAL LOW (ref 7–16)
BUN: 10 MG/DL (ref 6–23)
CO2: 28 MMOL/L (ref 21–32)
Calcium: 9.2 MG/DL (ref 8.3–10.4)
Chloride: 106 MMOL/L (ref 98–107)
Creatinine: 1.08 MG/DL (ref 0.8–1.5)
GFR est AA: >60 mL/min/{1.73_m2} (ref >60)
GFR est non-AA: >60 mL/min/{1.73_m2} (ref >60)
Glucose: 94 MG/DL (ref 65–100)
Potassium: 4.2 MMOL/L (ref 3.5–5.1)
Sodium: 139 MMOL/L (ref 136–145)

## 2012-09-13 LAB — EKG, 12 LEAD, INITIAL
Atrial Rate: 49 {beats}/min
Calculated P Axis: 76 degrees
Calculated R Axis: 78 degrees
Calculated T Axis: 75 degrees
P-R Interval: 166 ms
Q-T Interval: 460 ms
QRS Duration: 84 ms
QTC Calculation (Bezet): 415 ms
Ventricular Rate: 49 {beats}/min

## 2012-09-13 LAB — BNP: BNP: 6 pg/mL

## 2012-09-13 LAB — CBC WITH AUTOMATED DIFF
ABS. BASOPHILS: 0 10*3/uL (ref 0.0–0.2)
ABS. EOSINOPHILS: 0.2 10*3/uL (ref 0.0–0.8)
ABS. IMM. GRANS.: 0 10*3/uL (ref 0.0–0.5)
ABS. LYMPHOCYTES: 1.6 10*3/uL (ref 0.5–4.6)
ABS. MONOCYTES: 0.4 10*3/uL (ref 0.1–1.3)
ABS. NEUTROPHILS: 2.6 10*3/uL (ref 1.7–8.2)
BASOPHILS: 0 % (ref 0.0–2.0)
EOSINOPHILS: 4 % (ref 0.5–7.8)
HCT: 38.4 % — ABNORMAL LOW (ref 41.1–50.3)
HGB: 12.3 g/dL — ABNORMAL LOW (ref 13.6–17.2)
IMMATURE GRANULOCYTES: 0 % (ref 0.0–5.0)
LYMPHOCYTES: 33 % (ref 13–44)
MCH: 28.4 PG (ref 26.1–32.9)
MCHC: 32 g/dL (ref 31.4–35.0)
MCV: 88.7 FL (ref 79.6–97.8)
MONOCYTES: 9 % (ref 4.0–12.0)
MPV: 9.3 FL — ABNORMAL LOW (ref 10.8–14.1)
NEUTROPHILS: 54 % (ref 43–78)
PLATELET: 264 10*3/uL (ref 150–450)
RBC: 4.33 M/uL (ref 4.23–5.67)
RDW: 14.5 % (ref 11.9–14.6)
WBC: 4.8 10*3/uL (ref 4.3–11.1)

## 2012-09-13 LAB — PROTHROMBIN TIME + INR
INR: 2.6 — ABNORMAL HIGH (ref 0.9–1.2)
Prothrombin time: 28.6 s — ABNORMAL HIGH (ref 9.6–11.6)

## 2012-09-13 LAB — POC TROPONIN: Troponin-I (POC): 0 ng/ml (ref 0.0–0.08)

## 2012-09-13 NOTE — ED Notes (Signed)
Patient resting quietly in bed. Respirations present. No distress noted. No interventions needed at this time.

## 2012-09-13 NOTE — ED Notes (Signed)
Pt st he woke this am with shortness of breath. Pt st numbness and tingling in left arm and forehead. Pt st coumadin has been increased to 10mg  of coumadin. Pt st dose was increased last week.

## 2012-09-13 NOTE — ED Provider Notes (Addendum)
Patient is a 42 y.o. male presenting with numbness. The history is provided by the patient.   Numbness  This is a new problem. Episode onset: 10:35ish this am after awakening. The problem has been gradually improving. There was left upper extremity (felt numbness under tongue, had numbness under tongue yesterday as well.) focality noted. Primary symptoms include loss of sensation.Pertinent negatives include no focal weakness, no loss of balance, no slurred speech, no speech difficulty, no movement disorder and no mental status change. Primary symptoms comment: started with shortness of breath, and then developed left arm numbness, bt withOUT weakness. Associated symptoms include shortness of breath. Pertinent negatives include no chest pain, no vomiting, no headaches and no nausea. Associated medical issues do not include trauma, seizures or CVA. Associated medical issues comments: has been on and off of his celexa.        Past Medical History   Diagnosis Date   ??? Hypertension    ??? DVT (deep venous thrombosis) 12/13     had EKOS and lysis, IVC         Past Surgical History   Procedure Laterality Date   ??? Hx vascular access       IVC filter for DVT   ??? Hx other surgical       greenfield filter         Family History   Problem Relation Age of Onset   ??? Hypertension          History     Social History   ??? Marital Status: SINGLE     Spouse Name: N/A     Number of Children: N/A   ??? Years of Education: N/A     Occupational History   ??? Not on file.     Social History Main Topics   ??? Smoking status: Former Smoker     Quit date: 11/07/2011   ??? Smokeless tobacco: Never Used   ??? Alcohol Use: No   ??? Drug Use: No   ??? Sexually Active: Not on file     Other Topics Concern   ??? Not on file     Social History Narrative   ??? No narrative on file                  ALLERGIES: Review of patient's allergies indicates no known allergies.      Review of Systems   Constitutional: Negative for fever and chills.   HENT: Negative for sore throat  and rhinorrhea.    Eyes: Negative for discharge and redness.   Respiratory: Positive for shortness of breath. Negative for cough.    Cardiovascular: Negative for chest pain and palpitations.   Gastrointestinal: Negative for nausea, vomiting, abdominal pain and diarrhea.   Musculoskeletal: Negative for back pain and arthralgias.   Skin: Negative for rash.   Neurological: Positive for numbness. Negative for dizziness, focal weakness, speech difficulty, headaches and loss of balance.   All other systems reviewed and are negative.        Filed Vitals:    09/13/12 1400 09/13/12 1410 09/13/12 1420 09/13/12 1430   BP:       Pulse: 59 54 50 55   Temp:       Resp: 15 22 14 16    Height:       Weight:       SpO2: 100% 99% 100% 100%            Physical Exam   Nursing note and  vitals reviewed.  Constitutional: He is oriented to person, place, and time. He appears well-developed and well-nourished.   HENT:   Head: Normocephalic and atraumatic.   Eyes: Conjunctivae and EOM are normal. Pupils are equal, round, and reactive to light. Right eye exhibits no discharge. Left eye exhibits no discharge. No scleral icterus.   Neck: Normal range of motion. Neck supple.   Cardiovascular: Normal rate, regular rhythm and normal heart sounds.  Exam reveals no gallop.    No murmur heard.  Pulmonary/Chest: Effort normal and breath sounds normal. No respiratory distress. He has no wheezes. He has no rales.   Abdominal: Soft. Bowel sounds are normal. There is no tenderness. There is no guarding.   Musculoskeletal: Normal range of motion. He exhibits no edema.   Neurological: He is alert and oriented to person, place, and time. No cranial nerve deficit. He exhibits normal muscle tone. Coordination normal.   cni 2-12  No drift  Nl sensation (left arm is back to normal)     Skin: Skin is warm and dry. He is not diaphoretic.   Psychiatric: He has a normal mood and affect. His behavior is normal.        MDM    Procedures

## 2012-09-13 NOTE — ED Notes (Signed)
Patient resting quietly in bed. Respirations present. No distress noted. No interventions needed at this time.  at

## 2012-09-13 NOTE — ED Notes (Signed)
Patient in CT at this time

## 2012-09-13 NOTE — ED Notes (Signed)
The discharge instructions, follow-up information, medications, and prescriptions were reviewed with the patient. The patient verbalized understanding and all questions were answered. A copy of the discharge instructions were given to patient upon discharge. The patient was able to transport with no distress via ambulation and escorted by RN. The patient was reminded to not drive while under the influence of medications. MD notified of abnormal/normal vital signs. No orders received at this time. All LDAs removed prior to patient leaving the facility.    Patient signed paper discharge instructions. Signed copy placed on chart.

## 2012-12-27 LAB — METABOLIC PANEL, COMPREHENSIVE
A-G Ratio: 1 — ABNORMAL LOW (ref 1.2–3.5)
ALT (SGPT): 23 U/L (ref 12–65)
AST (SGOT): 23 U/L (ref 15–37)
Albumin: 4.2 g/dL (ref 3.5–5.0)
Alk. phosphatase: 83 U/L (ref 50–136)
Anion gap: 6 mmol/L — ABNORMAL LOW (ref 7–16)
BUN: 12 MG/DL (ref 6–23)
Bilirubin, total: 0.3 MG/DL (ref 0.2–1.1)
CO2: 26 mmol/L (ref 21–32)
Calcium: 9 MG/DL (ref 8.3–10.4)
Chloride: 103 mmol/L (ref 98–107)
Creatinine: 1.27 MG/DL (ref 0.8–1.5)
GFR est AA: >60 mL/min/{1.73_m2} (ref >60)
GFR est non-AA: >60 mL/min/{1.73_m2} (ref >60)
Globulin: 4.2 g/dL — ABNORMAL HIGH (ref 2.3–3.5)
Glucose: 117 mg/dL — ABNORMAL HIGH (ref 65–100)
Potassium: 3.8 mmol/L (ref 3.5–5.1)
Protein, total: 8.4 g/dL — ABNORMAL HIGH (ref 6.3–8.2)
Sodium: 135 mmol/L — ABNORMAL LOW (ref 136–145)

## 2012-12-27 LAB — CBC WITH AUTOMATED DIFF
ABS. BASOPHILS: 0 10*3/uL (ref 0.0–0.2)
ABS. EOSINOPHILS: 0.3 10*3/uL (ref 0.0–0.8)
ABS. IMM. GRANS.: 0 10*3/uL (ref 0.0–0.5)
ABS. LYMPHOCYTES: 1.8 10*3/uL (ref 0.5–4.6)
ABS. MONOCYTES: 0.8 10*3/uL (ref 0.1–1.3)
ABS. NEUTROPHILS: 3.9 10*3/uL (ref 1.7–8.2)
BASOPHILS: 0 % (ref 0.0–2.0)
EOSINOPHILS: 4 % (ref 0.5–7.8)
HCT: 42.6 % (ref 41.1–50.3)
HGB: 13.9 g/dL (ref 13.6–17.2)
IMMATURE GRANULOCYTES: 0.1 % (ref 0.0–5.0)
LYMPHOCYTES: 27 % (ref 13–44)
MCH: 29.1 PG (ref 26.1–32.9)
MCHC: 32.6 g/dL (ref 31.4–35.0)
MCV: 89.3 FL (ref 79.6–97.8)
MONOCYTES: 12 % (ref 4.0–12.0)
MPV: 9.7 FL — ABNORMAL LOW (ref 10.8–14.1)
NEUTROPHILS: 57 % (ref 43–78)
PLATELET: 222 10*3/uL (ref 150–450)
RBC: 4.77 M/uL (ref 4.23–5.67)
RDW: 14.7 % — ABNORMAL HIGH (ref 11.9–14.6)
WBC: 6.8 10*3/uL (ref 4.3–11.1)

## 2012-12-27 LAB — LIPASE: Lipase: 85 U/L (ref 73–393)

## 2012-12-27 NOTE — ED Notes (Signed)
Pt states abdominal pain x 3 days.  Pt denies N/V/D/F.

## 2012-12-27 NOTE — ED Notes (Signed)
Pt stated he is feeling better after gi cocktail. Discussed lab with him. He stated he had run out of coumadin. Takes 7.5 mg daily. Asked again about urinary sx. Some frequency and urgency. No penile drainage. No hx of std.

## 2012-12-27 NOTE — ED Notes (Signed)
I have reviewed discharge instructions with the patient.  The patient verbalized understanding. Patient ambulatory to lobby in no acute distress.    Labrea Eccleston M Vaughn, RN

## 2012-12-27 NOTE — ED Provider Notes (Addendum)
HPI Comments: Some diffuse abd pain intermittently for last couple of days. Denied any nausea or vomiting. Ate some spicey doritos about 20 min pta and stated this caused his pain to be much worse. Took nothing for his pain.     Patient is a 42 y.o. male presenting with abdominal pain. The history is provided by the patient and medical records.   Abdominal Pain   This is a new problem. The current episode started 2 days ago. The problem occurs constantly. Progression since onset: intermittently since onset. The pain is associated with an unknown factor. Pain location: mid abd  The quality of the pain is aching and dull. The pain is at a severity of 7/10. Pertinent negatives include no fever, no belching, no diarrhea, no hematochezia, no nausea, no vomiting, no constipation, no dysuria, no frequency, no hematuria, no chest pain and no back pain. Exacerbated by: ate "spicey doritos" just pta and this made his pain worse.  The pain is relieved by nothing. Past workup comments: none. His past medical history does not include PUD, GERD, ulcerative colitis, irritable bowel syndrome, pancreatitis or diverticulitis. none       Past Medical History   Diagnosis Date   ??? Hypertension    ??? DVT (deep venous thrombosis) 12/13     had EKOS and lysis, IVC         Past Surgical History   Procedure Laterality Date   ??? Hx vascular access       IVC filter for DVT   ??? Hx other surgical       greenfield filter         Family History   Problem Relation Age of Onset   ??? Hypertension          History     Social History   ??? Marital Status: SINGLE     Spouse Name: N/A     Number of Children: N/A   ??? Years of Education: N/A     Occupational History   ??? Not on file.     Social History Main Topics   ??? Smoking status: Former Smoker     Quit date: 11/07/2011   ??? Smokeless tobacco: Never Used   ??? Alcohol Use: No   ??? Drug Use: No   ??? Sexually Active: Not on file     Other Topics Concern   ??? Not on file     Social History Narrative   ??? No narrative  on file                  ALLERGIES: Review of patient's allergies indicates no known allergies.      Review of Systems   Constitutional: Negative for fever, activity change and appetite change.   Cardiovascular: Negative for chest pain.   Gastrointestinal: Positive for abdominal pain. Negative for nausea, vomiting, diarrhea, constipation and hematochezia.   Genitourinary: Negative.  Negative for dysuria, frequency and hematuria.   Musculoskeletal: Negative for back pain.   All other systems reviewed and are negative.        Filed Vitals:    12/27/12 1838   BP: 117/63   Pulse: 85   Temp: 99.2 ??F (37.3 ??C)   Resp: 18   Height: 6\' 1"  (1.854 m)   Weight: 72.576 kg (160 lb)   SpO2: 99%            Physical Exam   Nursing note and vitals reviewed.  Constitutional: He is oriented to  person, place, and time. He appears well-developed and well-nourished. No distress.   HENT:   Head: Normocephalic and atraumatic.   Right Ear: External ear normal.   Left Ear: External ear normal.   Mouth/Throat: Oropharynx is clear and moist.   Eyes: EOM are normal. Pupils are equal, round, and reactive to light.   Neck: Normal range of motion. Neck supple.   Cardiovascular: Normal rate and regular rhythm.    Pulmonary/Chest: Effort normal and breath sounds normal. He has no wheezes. He has no rales.   Abdominal: Soft. Bowel sounds are normal.   Minimal tenderness to palpation, no point tenderness.    Musculoskeletal: Normal range of motion. He exhibits no edema and no tenderness.   Lymphadenopathy:     He has no cervical adenopathy.   Neurological: He is alert and oriented to person, place, and time.   Skin: Skin is warm and dry.   Psychiatric: He has a normal mood and affect.        MDM    Procedures

## 2012-12-27 NOTE — ED Notes (Signed)
Called lab for add on labs.    Hinda Lenis, RN

## 2012-12-28 LAB — PROTHROMBIN TIME + INR
INR: 1 (ref 0.9–1.2)
Prothrombin time: 11 s (ref 9.6–11.6)

## 2012-12-28 LAB — URINE MICROSCOPIC

## 2012-12-28 MED ADMIN — GI Cocktail: ORAL | @ 01:00:00 | NDC 99990001908

## 2012-12-28 MED FILL — GI COCKTAIL: ORAL | Qty: 30

## 2012-12-28 NOTE — Progress Notes (Signed)
Late entry - patient well known to me from prev visits and hospital stay for DVT- connected patient to [facility] and has ability to have management of his coumadin doses - he admits he has not benn following up and has missed 2 appoint s and ran out of his Coumadin- - stressed to him that his non complainace has a possible complication of Death- he voiced his understanding of that and how he gets back to [facility] - would qualify for HOP and signed release for participation

## 2012-12-29 NOTE — Progress Notes (Signed)
Quick Note:    Pt dc to home on keflex, urine culture is noted normal skin flora    ______

## 2012-12-30 LAB — CULTURE, URINE
Culture result:: <10000
Culture: <10000

## 2013-01-01 LAB — CHLAMYDIA / GC-AMPLIFIED
Chlamydia trachomatis, NAA: POSITIVE — AB
Neisseria gonorrhoeae, NAA: NEGATIVE

## 2013-01-01 NOTE — ED Notes (Cosign Needed)
Attempted to contact pt. Regarding positive Chlamydia urine culture. Left message on voice mail to contact ER.

## 2013-01-02 NOTE — Progress Notes (Signed)
Quick Note:    Pt called and message left to call me at office tomorrow for tx of chlamydia. LAB  ______

## 2013-01-03 NOTE — Progress Notes (Signed)
Quick Note:    [facility] attempted to call pt but could not get an answer.  ______

## 2013-01-16 ENCOUNTER — Encounter (HOSPITAL_COMMUNITY): Payer: Self-pay | Admitting: *Deleted

## 2013-01-16 ENCOUNTER — Emergency Department (HOSPITAL_COMMUNITY)
Admission: EM | Admit: 2013-01-16 | Discharge: 2013-01-16 | Disposition: A | Discharge status: Home / Self Care | Payer: Self-pay | Attending: Emergency Medicine | Admitting: Emergency Medicine

## 2013-01-16 DIAGNOSIS — M7989 Other specified soft tissue disorders: Secondary | ICD-10-CM

## 2013-01-16 DIAGNOSIS — I82409 Acute embolism and thrombosis of unspecified deep veins of unspecified lower extremity: Secondary | ICD-10-CM | POA: Insufficient documentation

## 2013-01-16 DIAGNOSIS — Z7901 Long term (current) use of anticoagulants: Secondary | ICD-10-CM | POA: Insufficient documentation

## 2013-01-16 DIAGNOSIS — I82401 Acute embolism and thrombosis of unspecified deep veins of right lower extremity: Secondary | ICD-10-CM

## 2013-01-16 HISTORY — DX: Acute embolism and thrombosis of unspecified deep veins of unspecified lower extremity: I82.409

## 2013-01-16 LAB — POCT I-STAT, CHEM 8
BUN: 19 mg/dL (ref 6–23)
Creatinine, Ser: 1.7 mg/dL — ABNORMAL HIGH (ref 0.50–1.35)
Sodium: 143 mEq/L (ref 135–145)
TCO2: 29 mmol/L (ref 0–100)

## 2013-01-16 MED ORDER — ENOXAPARIN SODIUM 100 MG/ML ~~LOC~~ SOLN: 1.0000 mg/kg | Freq: Two times a day (BID) | SUBCUTANEOUS | Status: DC

## 2013-01-16 MED ORDER — ENOXAPARIN SODIUM 80 MG/0.8ML ~~LOC~~ SOLN
75.0000 mg | Freq: Once | SUBCUTANEOUS | Status: AC
  Administered 2013-01-16: 75 mg via SUBCUTANEOUS
  Filled 2013-01-16: qty 0.8

## 2013-01-16 MED ORDER — WARFARIN SODIUM 5 MG PO TABS: 5.0000 mg | ORAL_TABLET | Freq: Every day | ORAL | Status: DC

## 2013-01-16 NOTE — Progress Notes (Signed)
*  Preliminary Results* Right lower extremity venous duplex completed. There is evidence of diffuse deep vein thrombosis of varying age affecting all levels of all right lower extremity veins. There is acute dep vein thrombosis involving the right popliteal, posterior tibial, and peroneal veins. There is no evidence of right Baker's cyst.  Preliminary results discussed with Dierdre Forth, PA.  01/16/2013 7:23 PM Gertie Fey, RVT, RDCS, RDMS

## 2013-01-16 NOTE — ED Provider Notes (Addendum)
History     CSN: [id number]  Arrival date & time 01/16/13  1826   First MD Initiated Contact with Patient 01/16/13 2031      Chief Complaint  Patient presents with  . Leg Swelling    (Consider location/radiation/quality/duration/timing/severity/associated sxs/prior treatment) HPI Comments: Patient presents with right leg swelling. He has a history of a DVT that was diagnosed in January of this year. This was diagnosed in [place]. He was started on Coumadin and he states she also had a Greenfield filter placed. He denies any history of blood clot in his lungs. He states that he recently moved to this area and is been out of his Coumadin for about one month. He's noted a one-week history of worsening swelling of his right leg. He denies any pain in his leg. He denies any claudication symptoms. He denies any chest pain or shortness of breath.   Past Medical History  Diagnosis Date  . DVT (deep venous thrombosis)     Past Surgical History  Procedure Laterality Date  . Green filter      No family history on file.  History  Substance Use Topics  . Smoking status: Never Smoker   . Smokeless tobacco: Not on file  . Alcohol Use: No      Review of Systems  Constitutional: Negative for fever, chills, diaphoresis and fatigue.  HENT: Negative for congestion, rhinorrhea and sneezing.   Eyes: Negative.   Respiratory: Negative for cough, chest tightness and shortness of breath.   Cardiovascular: Negative for chest pain and leg swelling.  Gastrointestinal: Negative for nausea, vomiting, abdominal pain, diarrhea and blood in stool.  Genitourinary: Negative for frequency, hematuria, flank pain and difficulty urinating.  Musculoskeletal: Negative for back pain and arthralgias.  Skin: Negative for rash.  Neurological: Negative for dizziness, speech difficulty, weakness, numbness and headaches.    Allergies  Review of patient's allergies indicates no known allergies.  Home  Medications   Current Outpatient Rx  Name  Route  Sig  Dispense  Refill  . warfarin (COUMADIN) 7.5 MG tablet   Oral   Take 7.5 mg by mouth daily.           BP 137/76  Pulse 75  Temp(Src) 98.3 F (36.8 C) (Oral)  Resp 18  SpO2 97%  Physical Exam  Constitutional: He is oriented to person, place, and time. He appears well-developed and well-nourished.  HENT:  Head: Normocephalic and atraumatic.  Eyes: Pupils are equal, round, and reactive to light.  Neck: Normal range of motion. Neck supple.  Cardiovascular: Normal rate, regular rhythm and normal heart sounds.   Pulmonary/Chest: Effort normal and breath sounds normal. No respiratory distress. He has no wheezes. He has no rales. He exhibits no tenderness.  Abdominal: Soft. Bowel sounds are normal. There is no tenderness. There is no rebound and no guarding.  Musculoskeletal: Normal range of motion. He exhibits no edema.  Patient has mild edema to the right leg as compared to left. There is no calf tenderness. He has normal sensation in the foot. Normal motor function in the foot. Pulses are intact.  Lymphadenopathy:    He has no cervical adenopathy.  Neurological: He is alert and oriented to person, place, and time.  Skin: Skin is warm and dry. No rash noted.  Psychiatric: He has a normal mood and affect.    ED Course  Procedures (including critical care time)  Results for orders placed during the hospital encounter of 01/16/13  [facility]  Result Value Range   Prothrombin Time 12.5  11.6 - 15.2 seconds   INR 0.94  0.00 - 1.49  POCT I-STAT, CHEM 8      Result Value Range   Sodium 143  135 - 145 mEq/L   Potassium 4.4  3.5 - 5.1 mEq/L   Chloride 105  96 - 112 mEq/L   BUN 19  6 - 23 mg/dL   Creatinine, Ser 4.09 (*) 0.50 - 1.35 mg/dL   Glucose, Bld 80  70 - 99 mg/dL   Calcium, Ion 8.11  9.14 - 1.23 mmol/L   TCO2 29  0 - 100 mmol/L   Hemoglobin 13.3  13.0 - 17.0 g/dL   HCT 78.2  95.6 - 21.3 %   No results  found.   No results found.   1. 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 I will go ahead and give him a dose of Lovenox here and given a prescription for a four-day supply of Lovenox. I also will start him on Coumadin. I advised him on a [facility] within the next one to 2 days to get followup for this. I advised him if he has any problems getting his medicines or getting followup he needs to return. Also advised to return if he has any chest pain trouble breathing or other worsening symptoms.        Rolan Bucco, MD 01/16/13 2207  Rolan Bucco, MD 01/16/13 [phone]

## 2013-01-16 NOTE — ED Notes (Signed)
Pt has history of blood clots in the right leg and has a green filter.  Pt states that over last 2 weeks has started having swelling to right leg and pulse palpable

## 2013-01-18 ENCOUNTER — Encounter (HOSPITAL_COMMUNITY): Payer: Self-pay | Admitting: Emergency Medicine

## 2013-01-18 ENCOUNTER — Emergency Department (INDEPENDENT_AMBULATORY_CARE_PROVIDER_SITE_OTHER)
Admission: EM | Admit: 2013-01-18 | Discharge: 2013-01-18 | Disposition: A | Discharge status: Home / Self Care | Payer: Self-pay | Source: Home / Self Care | Attending: Emergency Medicine | Admitting: Emergency Medicine

## 2013-01-18 DIAGNOSIS — I82409 Acute embolism and thrombosis of unspecified deep veins of unspecified lower extremity: Secondary | ICD-10-CM

## 2013-01-18 DIAGNOSIS — I82401 Acute embolism and thrombosis of unspecified deep veins of right lower extremity: Secondary | ICD-10-CM

## 2013-01-18 NOTE — ED Provider Notes (Signed)
Chief Complaint:   Chief Complaint  Patient presents with  . Medication Refill    History of Present Illness:   Jim Hawkins is a 42 year old male who has a history of DVT, first diagnosed in January of 2014 in [place] at [facility] in [place]. At that time he underwent some type or procedure. He says a catheter was placed in his neck. He had a Greenfield filter inserted. He was discharged on Coumadin, but this ran out in April. He went to the emergency room on June 11, 2 days ago, because of pain and swelling in the right leg. A repeat venous duplex showed a fresh clot in the deep venous system as well as some old resolving clot. He was given a refill on his Coumadin 7.5 mg a day and one shot of Lovenox. He was also given a prescription for 4 Lovenox syringes, but he could not get these filled because of excessive cost. The swelling and pain have improved. He denies any shortness of breath or chest pain. He went to the [facility] today and wanted to get support hose, but they could not give him 1 without a prescription, so he is here today to get a prescription for support hose. He does not have an appointment with a primary care clinic yet. He was given the phone number of the [facility] and [facility], but has not made an appointment there yet.  Review of Systems:  Other than noted above, the patient denies any of the following symptoms: Systemic:  No fever, chills, sweats, weight gain or loss. Respiratory:  No coughing, wheezing, or shortness of breath. Cardiac:  No chest pain, tightness, pressure or syncope. GI:  No abdominal pain, swelling, distension, nausea, or vomiting. GU:  No dysuria, frequency, or hematuria. Ext:  No joint pain, muscle pain, or weakness. Skin:  No rash or itching. Neuro:  No paresthesias.  PMFSH:  Past medical history, family history, social history, meds, and allergies were reviewed.    Physical Exam:   Vital  signs:  There were no vitals taken for this visit. Gen:  Alert, oriented, in no distress. Neck:  No tenderness, adenopathy, or JVD. Lungs:  Breath sounds clear and equal bilaterally.  No rales, rhonchi or wheezes. Heart:  Regular rhythm, no gallops or murmers. Abdomen:  Soft, nontender, no organomegaly or mass. Ext:  No swelling, erythema, heat, pitting edema, or tenderness to palpation. There no distended veins, no calf tenderness, Homans sign is negative. Pedal pulses are full. Neuro:  Alert and oriented times 3.  No muscle weakness.  Sensation intact to light touch. Skin:  Warm and dry.  No rash or skin lesions.  Assessment:  The encounter diagnosis was DVT (deep venous thrombosis), right.  He needs followup fairly soon to follow his pro time. Will attempt to get an appointment at the [facility] and [facility]. The cause for his DVT still remains unknown in this otherwise healthy young man. He will probably need a clotting workup.  Plan:   1.  The following meds were prescribed:   Discharge Medication List as of 01/18/2013  2:59 PM     He was given a written prescription for 3 pairs of thigh-high medium compression hose with a pressure of 15-20.  2.  The patient was instructed in symptomatic care and handouts were given. 3.  The patient was told to return if becoming worse in any way, if no better in 3 or  4 days, and given some red flag symptoms such as shortness of breath or chest pain that would indicate earlier return. 4.  Follow up at [facility] and [facility].    Reuben Likes, MD 01/18/13 [phone]

## 2013-01-18 NOTE — ED Notes (Signed)
Pt reports he was seen down at the [facility] ED on 6/11 for blood clots... Reports they forgot to Rx him a compression hose for right leg Voices no concerns at the moment... He is alert and oriented w/no signs of acute distress.

## 2013-01-18 NOTE — ED Notes (Signed)
Rx for compression hose given to pt and faxed to [facility] at [phone].

## 2013-01-29 NOTE — Telephone Encounter (Signed)
Call ended

## 2013-02-07 ENCOUNTER — Inpatient Hospital Stay: Payer: Self-pay

## 2013-10-02 ENCOUNTER — Encounter (HOSPITAL_COMMUNITY): Payer: Self-pay | Admitting: Emergency Medicine

## 2013-10-02 ENCOUNTER — Emergency Department (HOSPITAL_COMMUNITY)
Admission: EM | Admit: 2013-10-02 | Discharge: 2013-10-02 | Discharge status: Against Medical Advice | Payer: Self-pay | Attending: Emergency Medicine | Admitting: Emergency Medicine

## 2013-10-02 DIAGNOSIS — M7989 Other specified soft tissue disorders: Secondary | ICD-10-CM | POA: Insufficient documentation

## 2013-10-02 DIAGNOSIS — Z9119 Patient's noncompliance with other medical treatment and regimen: Secondary | ICD-10-CM

## 2013-10-02 DIAGNOSIS — Z5320 Procedure and treatment not carried out because of patient's decision for unspecified reasons: Secondary | ICD-10-CM

## 2013-10-02 HISTORY — DX: Other pulmonary embolism without acute cor pulmonale: I26.99

## 2013-10-02 NOTE — Progress Notes (Signed)
VASCULAR LAB PRELIMINARY  PRELIMINARY  PRELIMINARY  PRELIMINARY  Right lower extremity venous duplex completed.    Preliminary report:  Right:  No evidence of acute DVT. Chronic DVT noted in the CFV, FV, PFV, and popliteal vein.  No evidence of superficial thrombosis.  No Baker's cyst.  Aliza Moret, RVT 10/02/2013, 5:23 PM

## 2013-10-02 NOTE — ED Provider Notes (Signed)
CSN: [id number]     Arrival date & time 10/02/13  1519 History   First MD Initiated Contact with Patient 10/02/13 1721     Chief Complaint  Patient presents with  . Leg Swelling     (Consider location/radiation/quality/duration/timing/severity/associated sxs/prior Treatment) HPI  Past Medical History  Diagnosis Date  . DVT (deep venous thrombosis)   . Pulmonary embolism    Past Surgical History  Procedure Laterality Date  . Green filter     No family history on file. History  Substance Use Topics  . Smoking status: Never Smoker   . Smokeless tobacco: Not on file  . Alcohol Use: No    Review of Systems    Allergies  Review of patient's allergies indicates no known allergies.  Home Medications   Current Outpatient Rx  Name  Route  Sig  Dispense  Refill  . enoxaparin (LOVENOX) 80 MG/0.8ML injection   Subcutaneous   Inject 75 mg into the skin every 12 (twelve) hours.         Marland Kitchen. warfarin (COUMADIN) 6 MG tablet   Oral   Take 6 mg by mouth daily.          BP 108/69  Pulse 80  Temp(Src) 98.3 F (36.8 C)  Resp 20  SpO2 99% Physical Exam  ED Course  Procedures (including critical care time) Labs Review Labs Reviewed - No data to display Imaging Review No results found.  EKG Interpretation   None       MDM   Final diagnoses:  Left before treatment completed    Pt is a 43 y.o. male with Pmhx as above who presents with Leg swelling. He had Negative DVT study done prior to my interview.  Pt left before being seen.         Shanna CiscoMegan E Docherty, MD 10/03/13 [phone]

## 2013-10-02 NOTE — ED Notes (Signed)
U/S Tech here for pt.

## 2013-10-02 NOTE — ED Notes (Signed)
Dr. Micheline Mazeocherty informed that pt's U/S has been completed.

## 2013-10-02 NOTE — ED Notes (Addendum)
Pt c/o right leg swelling x 2 days; history of dvt and PE; started lovenox shots back up in November 2014; right leg warm to touch; strong pulse palpated right foot-marked; no swelling in foot--swelling in right popliteal region

## 2013-10-02 NOTE — ED Notes (Signed)
Pt not in room for consult with Dr. Micheline Mazeocherty.

## 2013-10-02 NOTE — Progress Notes (Signed)
[facility] provided pt with a [employer] application. Patient stated that he was homeless. Provided pt with a list of primary care resources, highlighting [facility].

## 2014-01-24 ENCOUNTER — Emergency Department (HOSPITAL_COMMUNITY)
Admission: EM | Admit: 2014-01-24 | Discharge: 2014-01-24 | Disposition: A | Discharge status: Home / Self Care | Payer: Self-pay | Attending: Emergency Medicine | Admitting: Emergency Medicine

## 2014-01-24 ENCOUNTER — Emergency Department (HOSPITAL_COMMUNITY): Payer: Self-pay

## 2014-01-24 ENCOUNTER — Encounter (HOSPITAL_COMMUNITY): Payer: Self-pay | Admitting: Emergency Medicine

## 2014-01-24 DIAGNOSIS — Z7901 Long term (current) use of anticoagulants: Secondary | ICD-10-CM | POA: Insufficient documentation

## 2014-01-24 DIAGNOSIS — M79672 Pain in left foot: Secondary | ICD-10-CM

## 2014-01-24 DIAGNOSIS — M79671 Pain in right foot: Secondary | ICD-10-CM

## 2014-01-24 DIAGNOSIS — M79609 Pain in unspecified limb: Secondary | ICD-10-CM | POA: Insufficient documentation

## 2014-01-24 DIAGNOSIS — Z86711 Personal history of pulmonary embolism: Secondary | ICD-10-CM | POA: Insufficient documentation

## 2014-01-24 DIAGNOSIS — Z5900 Homelessness unspecified: Secondary | ICD-10-CM | POA: Insufficient documentation

## 2014-01-24 DIAGNOSIS — Z86718 Personal history of other venous thrombosis and embolism: Secondary | ICD-10-CM | POA: Insufficient documentation

## 2014-01-24 DIAGNOSIS — R071 Chest pain on breathing: Secondary | ICD-10-CM | POA: Insufficient documentation

## 2014-01-24 DIAGNOSIS — M62838 Other muscle spasm: Secondary | ICD-10-CM | POA: Insufficient documentation

## 2014-01-24 LAB — BASIC METABOLIC PANEL
BUN: 21 mg/dL (ref 6–23)
CHLORIDE: 101 meq/L (ref 96–112)
CO2: 27 meq/L (ref 19–32)
CREATININE: 1.67 mg/dL — AB (ref 0.50–1.35)
Calcium: 9.9 mg/dL (ref 8.4–10.5)
GFR calc non Af Amer: 49 mL/min — ABNORMAL LOW (ref >90)
GFR, EST AFRICAN AMERICAN: 57 mL/min — AB (ref >90)
Glucose, Bld: 84 mg/dL (ref 70–99)
POTASSIUM: 5 meq/L (ref 3.7–5.3)
SODIUM: 142 meq/L (ref 137–147)

## 2014-01-24 LAB — CBC WITH DIFFERENTIAL/PLATELET
BASOS ABS: 0 10*3/uL (ref 0.0–0.1)
BASOS PCT: 0 % (ref 0–1)
Eosinophils Absolute: 0.1 10*3/uL (ref 0.0–0.7)
Eosinophils Relative: 3 % (ref 0–5)
HCT: 44.2 % (ref 39.0–52.0)
Hemoglobin: 14.8 g/dL (ref 13.0–17.0)
LYMPHS PCT: 25 % (ref 12–46)
Lymphs Abs: 1.3 10*3/uL (ref 0.7–4.0)
MCH: 30.1 pg (ref 26.0–34.0)
MCHC: 33.5 g/dL (ref 30.0–36.0)
MCV: 89.8 fL (ref 78.0–100.0)
Monocytes Absolute: 0.6 10*3/uL (ref 0.1–1.0)
Monocytes Relative: 11 % (ref 3–12)
NEUTROS ABS: 3.3 10*3/uL (ref 1.7–7.7)
NEUTROS PCT: 61 % (ref 43–77)
PLATELETS: 193 10*3/uL (ref 150–400)
RBC: 4.92 MIL/uL (ref 4.22–5.81)
RDW: 13.4 % (ref 11.5–15.5)
WBC: 5.4 10*3/uL (ref 4.0–10.5)

## 2014-01-24 LAB — PROTIME-INR
INR: 1.04 (ref 0.00–1.49)
PROTHROMBIN TIME: 13.4 s (ref 11.6–15.2)

## 2014-01-24 MED ORDER — METHOCARBAMOL 500 MG PO TABS: 500.0000 mg | ORAL_TABLET | Freq: Four times a day (QID) | ORAL | Status: DC | PRN

## 2014-01-24 MED ORDER — WARFARIN SODIUM 6 MG PO TABS: 6.0000 mg | ORAL_TABLET | Freq: Every day | ORAL | Status: DC

## 2014-01-24 MED ORDER — METHOCARBAMOL 500 MG PO TABS
1000.0000 mg | ORAL_TABLET | Freq: Once | ORAL | Status: AC
Start: 2014-01-24 — End: 2014-01-24
  Administered 2014-01-24: 1000 mg via ORAL
  Filled 2014-01-24: qty 2

## 2014-01-24 MED ORDER — SODIUM CHLORIDE 0.9 % IV BOLUS (SEPSIS)
1000.0000 mL | Freq: Once | INTRAVENOUS | Status: AC
  Administered 2014-01-24: 1000 mL via INTRAVENOUS

## 2014-01-24 MED ORDER — IOHEXOL 350 MG/ML SOLN
80.0000 mL | Freq: Once | INTRAVENOUS | Status: AC | PRN
Start: 2014-01-24 — End: 2014-01-24
  Administered 2014-01-24: 80 mL via INTRAVENOUS

## 2014-01-24 NOTE — ED Notes (Signed)
labd sent iv fluid started

## 2014-01-24 NOTE — Discharge Instructions (Signed)
Read the information below.  Use the prescribed medication as directed.  Please discuss all new medications with your pharmacist.  You may return to the Emergency Department at any time for worsening condition or any new symptoms that concern you.  If you develop worsening chest pain, shortness of breath, fever, you pass out, or become weak or dizzy, return to the ER for a recheck.      Muscle Cramps and Spasms Muscle cramps and spasms occur when a muscle or muscles tighten and you have no control over this tightening (involuntary muscle contraction). They are a common problem and can develop in any muscle. The most common place is in the calf muscles of the leg. Both muscle cramps and muscle spasms are involuntary muscle contractions, but they also have differences:   Muscle cramps are sporadic and painful. They may last a few seconds to a quarter of an hour. Muscle cramps are often more forceful and last longer than muscle spasms.  Muscle spasms may or may not be painful. They may also last just a few seconds or much longer. CAUSES  It is uncommon for cramps or spasms to be due to a serious underlying problem. In many cases, the cause of cramps or spasms is unknown. Some common causes are:   Overexertion.   Overuse from repetitive motions (doing the same thing over and over).   Remaining in a certain position for a long period of time.   Improper preparation, form, or technique while performing a sport or activity.   Dehydration.   Injury.   Side effects of some medicines.   Abnormally low levels of the salts and ions in your blood (electrolytes), especially potassium and calcium. This could happen if you are taking water pills (diuretics) or you are pregnant.  Some underlying medical problems can make it more likely to develop cramps or spasms. These include, but are not limited to:   Diabetes.   Parkinson disease.   Hormone disorders, such as thyroid problems.    Alcohol abuse.   Diseases specific to muscles, joints, and bones.   Blood vessel disease where not enough blood is getting to the muscles.  HOME CARE INSTRUCTIONS   Stay well hydrated. Drink enough water and fluids to keep your urine clear or pale yellow.  It may be helpful to massage, stretch, and relax the affected muscle.  For tight or tense muscles, use a warm towel, heating pad, or hot shower water directed to the affected area.  If you are sore or have pain after a cramp or spasm, applying ice to the affected area may relieve discomfort.  Put ice in a plastic bag.  Place a towel between your skin and the bag.  Leave the ice on for 15-20 minutes, 03-04 times a day.  Medicines used to treat a known cause of cramps or spasms may help reduce their frequency or severity. Only take over-the-counter or prescription medicines as directed by your caregiver. SEEK MEDICAL CARE IF:  Your cramps or spasms get more severe, more frequent, or do not improve over time.  MAKE SURE YOU:   Understand these instructions.  Will watch your condition.  Will get help right away if you are not doing well or get worse. Document Released: 01/14/2002 Document Revised: 11/19/2012 Document Reviewed: 07/11/2012 [facility] Patient Information 2015 [place], [place]. This information is not intended to replace advice given to you by your health care provider. Make sure you discuss any questions you have with your health  care provider.  Musculoskeletal Pain Musculoskeletal pain is muscle and boney aches and pains. These pains can occur in any part of the body. Your caregiver may treat you without knowing the cause of the pain. They may treat you if blood or urine tests, X-rays, and other tests were normal.  CAUSES There is often not a definite cause or reason for these pains. These pains may be caused by a type of germ (virus). The discomfort may also come from overuse. Overuse includes working out too  hard when your body is not fit. Boney aches also come from weather changes. Bone is sensitive to atmospheric pressure changes. HOME CARE INSTRUCTIONS   Ask when your test results will be ready. Make sure you get your test results.  Only take over-the-counter or prescription medicines for pain, discomfort, or fever as directed by your caregiver. If you were given medications for your condition, do not drive, operate machinery or power tools, or sign legal documents for 24 hours. Do not drink alcohol. Do not take sleeping pills or other medications that may interfere with treatment.  Continue all activities unless the activities cause more pain. When the pain lessens, slowly resume normal activities. Gradually increase the intensity and duration of the activities or exercise.  During periods of severe pain, bed rest may be helpful. Lay or sit in any position that is comfortable.  Putting ice on the injured area.  Put ice in a bag.  Place a towel between your skin and the bag.  Leave the ice on for 15 to 20 minutes, 3 to 4 times a day.  Follow up with your caregiver for continued problems and no reason can be found for the pain. If the pain becomes worse or does not go away, it may be necessary to repeat tests or do additional testing. Your caregiver may need to look further for a possible cause. SEEK IMMEDIATE MEDICAL CARE IF:  You have pain that is getting worse and is not relieved by medications.  You develop chest pain that is associated with shortness or breath, sweating, feeling sick to your stomach (nauseous), or throw up (vomit).  Your pain becomes localized to the abdomen.  You develop any new symptoms that seem different or that concern you. MAKE SURE YOU:   Understand these instructions.  Will watch your condition.  Will get help right away if you are not doing well or get worse. Document Released: 07/25/2005 Document Revised: 10/17/2011 Document Reviewed:  03/29/2013 [facility] Patient Information 2015 [place], [place]. This information is not intended to replace advice given to you by your health care provider. Make sure you discuss any questions you have with your health care provider.

## 2014-01-24 NOTE — Progress Notes (Signed)
ANTICOAGULATION CONSULT NOTE - Initial Consult  Pharmacy Consult for lovenox, coumadin Indication: history of DVT/PE  Allergies  Allergen Reactions  . Naproxen     Made patient feel like he was going to pass out.    Patient Measurements:    Vital Signs: Temp: 98.4 F (36.9 C) (06/19 1923) Temp src: Oral (06/19 1344) BP: 115/83 mmHg (06/19 1914) Pulse Rate: 56 (06/19 1914)  Labs:  Recent Labs  01/24/14 1500  HGB 14.8  HCT 44.2  PLT 193  LABPROT 13.4  INR 1.04  CREATININE 1.67*    CrCl is unknown because there is no height on file for the current visit.   Medical History: Past Medical History  Diagnosis Date  . DVT (deep venous thrombosis)   . Pulmonary embolism       Assessment: 43 yo male with history of DVT/PE and on coumadin at home (INR= 1.04). Pharmacy has been consulted to dose lovenox and coumadin.  Last known coumadin regimen: 6mg /day  Goal of Therapy:  INR 2-3 Monitor platelets by anticoagulation protocol: Yes   Plan:  -Discharge orders were ordered at the time of the consult and the patient was discharged before any coumadin or lovenox could be given  -The case was discussed with the PA and the Charge nurse -The patient is homeless and could not be contacted -The patient is noted with a follow up appointment on 01/27/14  Harland GermanAndrew Meyer, Pharm D 01/27/2014 5:01 PM

## 2014-01-24 NOTE — ED Notes (Signed)
Pt c/o bi-lateral foot cramps

## 2014-01-24 NOTE — ED Notes (Signed)
Pt asking for food and drink.  Needs to wait on his c-t

## 2014-01-24 NOTE — Discharge Planning (Signed)
[facility] Community Liaison  [facility] at 9:30. Patient educated on the importance of going to his follow up appointment. My contact information provided for any future questions or concerns.

## 2014-01-24 NOTE — ED Notes (Signed)
pts iv removed food given

## 2014-01-24 NOTE — ED Provider Notes (Signed)
CSN: [id number]     Arrival date & time 01/24/14  1337 History   First MD Initiated Contact with Patient 01/24/14 1503     Chief Complaint  Patient presents with  . Chest Pain  . Generalized Body Aches     (Consider location/radiation/quality/duration/timing/severity/associated sxs/prior Treatment) The history is provided by the patient.    Patient presents with bilateral foot pain and right shoulder/upper back/right arm/right chest wall cramping.  States he was diagnosed with blood clot in his lungs in November or December of 2014, has been taking coumadin since then but has not had his INR rechecked since early 2015.  States the pain in his bialteral feet occurs with plantar flexion of his feet and with walking, states it feels like a sharp, severe cramp.  The pain happens several times daily and has been ongoing x 6 months, worse over the past few weeks.  Pt also was reaching behind his back to wash his back today and felt a sudden pain and spasm in his "latisiumus dorsi," his right arm, his shoulder, and into his right chest wall.  States he "could see the nerves jumping."  He believes this is a side effect of the blood clot as he has been having similar episodes several times daily since he was diagnosed with the PE last year.   Denies fevers, leg swelling, calf or leg pain, SOB, cough.  Pt is homeless, walks a lot.  Denies recent immobilization.    Past Medical History  Diagnosis Date  . DVT (deep venous thrombosis)   . Pulmonary embolism    Past Surgical History  Procedure Laterality Date  . Green filter     No family history on file. History  Substance Use Topics  . Smoking status: Never Smoker   . Smokeless tobacco: Not on file  . Alcohol Use: No    Review of Systems  All other systems reviewed and are negative.     Allergies  Naproxen  Home Medications   Prior to Admission medications   Medication Sig Start Date End Date Taking? Authorizing Provider   warfarin (COUMADIN) 6 MG tablet Take 6 mg by mouth daily.    Historical Provider, MD   BP 110/68  Pulse 68  Temp(Src) 98.2 F (36.8 C) (Oral)  Resp 22  SpO2 99% Physical Exam  Nursing note and vitals reviewed. Constitutional: He appears well-developed and well-nourished. No distress.  HENT:  Head: Normocephalic and atraumatic.  Neck: Neck supple.  Cardiovascular: Normal rate and regular rhythm.   Pulmonary/Chest: Effort normal and breath sounds normal. No respiratory distress. He has no wheezes. He has no rales. He exhibits tenderness (diffuse right chest wall tenderness, pain is reproducible to palpation. ).  Abdominal: Soft. He exhibits no distension and no mass. There is no tenderness. There is no rebound and no guarding.  Musculoskeletal: He exhibits no edema.  Bilateral feet with tenderness at insertion of the plantar fascia.  No edema.  No erythema or warmth.  Dry flaky skin over plantar aspect in the area of the MTP joints, c/w tinea infection.    Neurological: He is alert. He exhibits normal muscle tone.  Skin: He is not diaphoretic.    ED Course  Procedures (including critical care time) Labs Review Labs Reviewed  BASIC METABOLIC PANEL - Abnormal; Notable for the following:    Creatinine, Ser 1.67 (*)    GFR calc non Af Amer 49 (*)    GFR calc Af Amer 57 (*)  All other components within normal limits  CBC WITH DIFFERENTIAL  PROTIME-INR    Imaging Review Dg Chest 2 View  01/24/2014   CLINICAL DATA:  Body aches, pains, cramps  EXAM: CHEST  2 VIEW  COMPARISON:  None.  FINDINGS: The lungs are hyperinflated likely secondary to COPD. There is no focal parenchymal opacity, pleural effusion, or pneumothorax. The heart and mediastinal contours are unremarkable.  The osseous structures are unremarkable.  IMPRESSION: No active cardiopulmonary disease.   Electronically Signed   By: Elige KoHetal  Patel   On: 01/24/2014 18:53   Ct Angio Chest Pe W/cm &/or Wo Cm  01/24/2014    CLINICAL DATA:  Right sided chest pain, hx PE, subtherapeutic INR  EXAM: CT ANGIOGRAPHY CHEST WITH CONTRAST  TECHNIQUE: Multidetector CT imaging of the chest was performed using the standard protocol during bolus administration of intravenous contrast. Multiplanar CT image reconstructions and MIPs were obtained to evaluate the vascular anatomy.  CONTRAST:  80mL OMNIPAQUE IOHEXOL 350 MG/ML SOLN  COMPARISON:  Two-view chest 01/24/2014  FINDINGS: The thoracic inlet is unremarkable.  There is no evidence of mediastinal masses nor adenopathy. No filling defects within the main, lobar, or segmental pulmonary arteries. There is no evidence of a thoracic aortic aneurysm nor dissection. The lungs are clear. The central airways are patent. Minimal biapical scarring versus atelectasis.  A 3.5 cm low attenuating focus anterior midpole left kidney likely benign cysts. This can be further characterized with nonemergent ultrasound if clinically warranted. Remaining visualized upper abdominal viscera are unremarkable.  There no aggressive appearing osseous lesions.  There is mild S-shaped scoliosis of the thoracic spine.  Review of the MIP images confirms the above findings.  IMPRESSION: No CT evidence of pulmonary arterial embolic disease.  Minimal biapical scarring.  Likely cyst left kidney this can be further characterized with nonemergent renal ultrasound.  Otherwise no  evidence of focal or acute abnormalities.   Electronically Signed   By: Salome HolmesHector  Cooper M.D.   On: 01/24/2014 19:05     EKG Interpretation None       Date: 01/25/2014  Rate: 95  Rhythm: normal sinus rhythm  QRS Axis: normal  Intervals: normal  ST/T Wave abnormalities: nonspecific ST/T changes  Conduction Disutrbances:none  Narrative Interpretation: LVH  Old EKG Reviewed: none available    MDM   Final diagnoses:  Muscle spasm  Bilateral foot pain   Pt clinically has muscle spasms in his right chest/ right shoulder/right upper back/right  arm - this occurred with movement.  He did note he associated this sensation with his prior PE.  CT angio negative.  Pt also c/o bilateral foot pain.  He is homeless and walks a lot.  Likely has some degree of plantar fasciitis. No edema, erythema, warmth.  Doubt DVT.  Doubt infection.  Pt d/c home with robaxin and coumadin prescription refill.   Pt claimed to be taking his coumadin daily but his INR was 1.0.  I ordered a pharmacy consult to dose lovenox and coumadin but he was accidentally discharged before this occurred.  Pt does have an appointment scheduled by Buddy DutyFelicia Evans, community liaison, [facility]  I strongly encouraged patient to keep this appointment.  It is unclear how long he is supposed to be on coumadin therapy, as pt thinks the reason he had a clot was from a long car ride.  Upon review of the chart, it is unclear that this is the case, as patient seems to have  had multiple clots in the past.  He has moved back and forth between [place] and [place], so there are gaps in his chart.  Pt feeling much better after robaxin.  Workup unremarkable.  D/C home.  Discussed result, findings, treatment, and follow up  with patient.  Pt given return precautions.  Pt verbalizes understanding and agrees with plan.         [place]stockEmily [place], PA-C 01/25/14 [phone]

## 2014-01-24 NOTE — ED Notes (Signed)
Pt. Stated, I ve had some chest pain and I feel like a lot of places on my body are locking up. My foot is locking up.  This has been going on for 2 weeks.

## 2014-01-26 NOTE — ED Provider Notes (Signed)
Medical screening examination/treatment/procedure(s) were performed by non-physician practitioner and as supervising physician I was immediately available for consultation/collaboration.   EKG Interpretation   Date/Time:  Friday January 24 2014 13:41:49 EDT Ventricular Rate:  95 PR Interval:  146 QRS Duration: 88 QT Interval:  322 QTC Calculation: 404 R Axis:   85 Text Interpretation:  Normal sinus rhythm Biatrial enlargement Left  ventricular hypertrophy Abnormal ECG ED PHYSICIAN INTERPRETATION AVAILABLE  IN [facility] Confirmed by TEST, Record ([id number]) on 01/26/2014 10:16:24  AM        Laray AngerKathleen M Kalasia Crafton, DO 01/26/14 1430

## 2014-01-27 ENCOUNTER — Ambulatory Visit: Payer: Self-pay | Attending: Internal Medicine | Admitting: Internal Medicine

## 2014-01-27 ENCOUNTER — Encounter: Payer: Self-pay | Admitting: Internal Medicine

## 2014-01-27 VITALS — BP 115/77 | HR 64 | Temp 98.3°F | Resp 16 | Ht 72.0 in | Wt 162.0 lb

## 2014-01-27 DIAGNOSIS — I82409 Acute embolism and thrombosis of unspecified deep veins of unspecified lower extremity: Secondary | ICD-10-CM | POA: Insufficient documentation

## 2014-01-27 DIAGNOSIS — B353 Tinea pedis: Secondary | ICD-10-CM | POA: Insufficient documentation

## 2014-01-27 DIAGNOSIS — I82401 Acute embolism and thrombosis of unspecified deep veins of right lower extremity: Secondary | ICD-10-CM

## 2014-01-27 LAB — POCT INR: INR: 1.1

## 2014-01-27 MED ORDER — TERBINAFINE HCL 1 % EX CREA: 1.0000 "application " | TOPICAL_CREAM | Freq: Two times a day (BID) | CUTANEOUS | Status: DC

## 2014-01-27 MED ORDER — WARFARIN SODIUM 6 MG PO TABS: 6.0000 mg | ORAL_TABLET | Freq: Every day | ORAL | Status: DC

## 2014-01-27 MED ORDER — METHOCARBAMOL 500 MG PO TABS: 500.0000 mg | ORAL_TABLET | Freq: Four times a day (QID) | ORAL | Status: DC | PRN

## 2014-01-27 NOTE — Progress Notes (Signed)
HFU Pt is here following up on his DVT and muscle spasm of his feet, right arm and right side . Pt has ran out of his coumadin for 3 days now.

## 2014-01-27 NOTE — Progress Notes (Signed)
LCSW met with patient in order to coordinate care needs.  LCSW provided patient with referral to [facility] due to prior history of bipolar disorder diagnosis.  LCSW also provided patient with listing from Job res[employer]ce and contact information for vocational rehab.  Patient very appreciative of the information.  Christene Lye MSW, LCSW

## 2014-01-27 NOTE — Progress Notes (Signed)
Patient ID: Jim Hawkins, male   DOB: [birth year], 43 y.o.   MRN: [id number]   Jim Hawkins, is a 43 y.o. male  NWG:[id number]:[id number]  [id number]  DOB - [birth year]  CC:  Chief Complaint  Patient presents with  . Establish Care       HPI: Jim Hawkins is a 43 y.o. male here today to establish medical care. He has no significant past medical history was diagnosed with DVT sometimes in 2014, started on Coumadin but he was taking it inconsistently, later found to have pulmonary embolism and was restarted on Coumadin, he recently [place] from [place] and has been taking Coumadin also inconsistently partly because of financial incapacity. His DVT originated according to him from a long distant travel where he was restricted for about 6 hours without an ability to move his legs. He has not been worked up for coagulopathic disease. He does not have a personal history of hypertension or diabetes. He is presently homeless and jobless. He claims he moved from [place] because he is fearful for his life because people are being killed around him and his family. He is single, no children of his own. He smoke cigarette about 2-3 single sticks a day, he does not drink alcohol. He is on 6 mg of Coumadin, last taken 4 days ago. He reports no side effects, no bleeding. Patient has No headache, No chest pain, No abdominal pain - No Nausea, No new weakness tingling or numbness, No Cough - SOB.  Allergies  Allergen Reactions  . Naproxen     Made patient feel like he was going to pass out.   Past Medical History  Diagnosis Date  . DVT (deep venous thrombosis)   . Pulmonary embolism    No current outpatient prescriptions on file prior to visit.   No current facility-administered medications on file prior to visit.   History reviewed. No pertinent family history. History   Social History  . Marital Status: Single    Spouse Name: N/A    Number of Children: N/A  . Years of Education: N/A    Occupational History  . Not on file.   Social History Main Topics  . Smoking status: Never Smoker   . Smokeless tobacco: Not on file  . Alcohol Use: No  . Drug Use: No  . Sexual Activity: Not on file   Other Topics Concern  . Not on file   Social History Narrative  . No narrative on file    Review of Systems: Constitutional: Negative for fever, chills, diaphoresis, activity change, appetite change and fatigue. HENT: Negative for ear pain, nosebleeds, congestion, facial swelling, rhinorrhea, neck pain, neck stiffness and ear discharge.  Eyes: Negative for pain, discharge, redness, itching and visual disturbance. Respiratory: Negative for cough, choking, chest tightness, shortness of breath, wheezing and stridor.  Cardiovascular: Negative for chest pain, palpitations and leg swelling. Gastrointestinal: Negative for abdominal distention. Genitourinary: Negative for dysuria, urgency, frequency, hematuria, flank pain, decreased urine volume, difficulty urinating and dyspareunia.  Musculoskeletal: Negative for back pain, joint swelling, arthralgia and gait problem. Neurological: Negative for dizziness, tremors, seizures, syncope, facial asymmetry, speech difficulty, weakness, light-headedness, numbness and headaches.  Hematological: Negative for adenopathy. Does not bruise/bleed easily. Psychiatric/Behavioral: Negative for hallucinations, behavioral problems, confusion, dysphoric mood, decreased concentration and agitation.    Objective:   Filed Vitals:   01/27/14 0908  BP: 115/77  Pulse: 64  Temp: 98.3 F (36.8 C)  Resp: 16    Physical Exam:  Constitutional: Patient appears well-developed and well-nourished. No distress. HENT: Normocephalic, atraumatic, External right and left ear normal. Oropharynx is clear and moist.  Eyes: Conjunctivae and EOM are normal. PERRLA, no scleral icterus. Neck: Normal ROM. Neck supple. No JVD. No tracheal deviation. No thyromegaly. CVS: RRR,  S1/S2 +, no murmurs, no gallops, no carotid bruit.  Pulmonary: Effort and breath sounds normal, no stridor, rhonchi, wheezes, rales.  Abdominal: Soft. BS +, no distension, tenderness, rebound or guarding.  Musculoskeletal: Normal range of motion. No edema and no tenderness.  Lymphadenopathy: No lymphadenopathy noted, cervical, inguinal or axillary Neuro: Alert. Normal reflexes, muscle tone coordination. No cranial nerve deficit. Skin: Skin is warm and dry. No rash noted. Not diaphoretic. No erythema. No pallor. Psychiatric: Normal mood and affect. Behavior, judgment, thought content normal.  Lab Results  Component Value Date   WBC 5.4 01/24/2014   HGB 14.8 01/24/2014   HCT 44.2 01/24/2014   MCV 89.8 01/24/2014   PLT 193 01/24/2014   Lab Results  Component Value Date   CREATININE 1.67* 01/24/2014   BUN 21 01/24/2014   NA 142 01/24/2014   K 5.0 01/24/2014   CL 101 01/24/2014   CO2 27 01/24/2014    No results found for this basename: HGBA1C   Lipid Panel  No results found for this basename: chol, trig, hdl, cholhdl, vldl, ldlcalc       Assessment and plan:   1. DVT (deep venous thrombosis), right  - INR is 1.1 today, we will restart Coumadin 6 mg tablet by mouth daily followup in one week for INR check Patient has been counseled extensively on the need to be compliant with medications Patient was counseled extensively about smoking cessation  - methocarbamol (ROBAXIN) 500 MG tablet; Take 1 tablet (500 mg total) by mouth every 6 (six) hours as needed for muscle spasms.  Dispense: 30 tablet; Refill: 0 - warfarin (COUMADIN) 6 MG tablet; Take 1 tablet (6 mg total) by mouth daily.  Dispense: 30 tablet; Refill: 3  2. Tinea pedis of left foot  - terbinafine (LAMISIL AT) 1 % cream; Apply 1 application topically 2 (two) times daily.  Dispense: 30 g; Refill: 0  Patient was referred to a Child psychotherapistsocial worker here to help patient with resources available in the community and to discuss his other  social needs  Return in about 1 week (around 02/03/2014), or if symptoms worsen or fail to improve, for INR Check, 3 months for regular follow up and annual physical.  The patient was given clear instructions to go to ER or return to medical center if symptoms don't improve, worsen or new problems develop. The patient verbalized understanding. The patient was told to call to get lab results if they haven't heard anything in the next week.     This note has been created with Education officer, environmentalDragon speech recognition software and smart phrase technology. Any transcriptional errors are unintentional.    Jeanann LewandowskyJEGEDE, OLUGBEMIGA, MD, MHA, FACP, FAAP [facility] [place], [place] [phone]   01/27/2014, 9:47 AM

## 2014-01-27 NOTE — Patient Instructions (Signed)
Deep Vein Thrombosis °A deep vein thrombosis (DVT) is a blood clot that develops in the deep, larger veins of the leg, arm, or pelvis. These are more dangerous than clots that might form in veins near the surface of the body. A DVT can lead to complications if the clot breaks off and travels in the bloodstream to the lungs.  °A DVT can damage the valves in your leg veins, so that instead of flowing upward, the blood pools in the lower leg. This is called post-thrombotic syndrome, and it can result in pain, swelling, discoloration, and sores on the leg. °CAUSES °Usually, several things contribute to blood clots forming. Contributing factors include: °· The flow of blood slows down. °· The inside of the vein is damaged in some way. °· You have a condition that makes blood clot more easily. °RISK FACTORS °Some people are more likely than others to develop blood clots. Risk factors include:  °· Older age, especially over 75 years of age. °· Having a family history of blood clots or if you have already had a blot clot. °· Having major or lengthy surgery. This is especially true for surgery on the hip, knee, or belly (abdomen). Hip surgery is particularly high risk. °· Breaking a hip or leg. °· Sitting or lying still for a long time. This includes long-distance travel, paralysis, or recovery from an illness or surgery. °· Having cancer or cancer treatment. °· Having a long, thin tube (catheter) placed inside a vein during a medical procedure. °· Being overweight (obese). °· Pregnancy and childbirth. °¨ Hormone changes make the blood clot more easily during pregnancy. °¨ The fetus puts pressure on the veins of the pelvis. °¨ There is a risk of injury to veins during delivery or a caesarean. The risk is highest just after childbirth. °· Medicines with the male hormone estrogen. This includes birth control pills and hormone replacement therapy. °· Smoking. °· Other circulation or heart problems. ° °SIGNS AND SYMPTOMS °When  a clot forms, it can either partially or totally block the blood flow in that vein. Symptoms of a DVT can include: °· Swelling of the leg or arm, especially if one side is much worse. °· Warmth and redness of the leg or arm, especially if one side is much worse. °· Pain in an arm or leg. If the clot is in the leg, symptoms may be more noticeable or worse when standing or walking. °The symptoms of a DVT that has traveled to the lungs (pulmonary embolism, PE) usually start suddenly and include: °· Shortness of breath. °· Coughing. °· Coughing up blood or blood-tinged phlegm. °· Chest pain. The chest pain is often worse with deep breaths. °· Rapid heartbeat. °Anyone with these symptoms should get emergency medical treatment right away. Call your local emergency services (911 in the [place] if you have these symptoms. °DIAGNOSIS °If a DVT is suspected, your health care provider will take a full medical history and perform a physical exam. Tests that also may be required include: °· Blood tests, including studies of the clotting properties of the blood. °· Ultrasonography to see if you have clots in your legs or lungs. °· X-rays to show the flow of blood when dye is injected into the veins (venography). °· Studies of your lungs if you have any chest symptoms. °PREVENTION °· Exercise the legs regularly. Take a brisk 30-minute walk every day. °· Maintain a weight that is appropriate for your height. °· Avoid sitting or lying in bed   for long periods of time without moving your legs. °· Women, particularly those over the age of 35 years, should consider the risks and benefits of taking estrogen medicines, including birth control pills. °· Do not smoke, especially if you take estrogen medicines. °· Long-distance travel can increase your risk of DVT. You should exercise your legs by walking or pumping the muscles every hour. °· In-hospital prevention: °¨ Many of the risk factors above relate to situations that exist with  hospitalization, either for illness, injury, or elective surgery. °¨ Your health care provider will assess you for the need for venous thromboembolism prophylaxis when you are admitted to the hospital. If you are having surgery, your surgeon will assess you the day of or day after surgery. °¨ Prevention may include medical and nonmedical measures. °TREATMENT °Once identified, a DVT can be treated. It can also be prevented in some circumstances. Once you have had a DVT, you may be at increased risk for a DVT in the future. The most common treatment for DVT is blood thinning (anticoagulant) medicine, which reduces the blood's tendency to clot. Anticoagulants can stop new blood clots from forming and stop old ones from growing. They cannot dissolve existing clots. Your body does this by itself over time. Anticoagulants can be given by mouth, by IV access, or by injection. Your health care provider will determine the best program for you. Other medicines or treatments that may be used are: °· Heparin or related medicines (low molecular weight heparin) are usually the first treatment for a blood clot. They act quickly. However, they cannot be taken orally. °¨ Heparin can cause a fall in a component of blood that stops bleeding and forms blood clots (platelets). You will be monitored with blood tests to be sure this does not occur. °· Warfarin is an anticoagulant that can be swallowed. It takes a few days to start working, so usually heparin or related medicines are used in combination. Once warfarin is working, heparin is usually stopped. °· Less commonly, clot dissolving drugs (thrombolytics) are used to dissolve a DVT. They carry a high risk of bleeding, so they are used mainly in severe cases, where your life or a limb is threatened. °· Very rarely, a blood clot in the leg needs to be removed surgically. °· If you are unable to take anticoagulants, your health care provider may arrange for you to have a filter placed  in a main vein in your abdomen. This filter prevents clots from traveling to your lungs. °HOME CARE INSTRUCTIONS °· Take all medicines prescribed by your health care provider. Only take over-the-counter or prescription medicines for pain, fever, or discomfort as directed by your health care provider. °· Warfarin. Most people will continue taking warfarin after hospital discharge. Your health care provider will advise you on the length of treatment (usually 3-6 months, sometimes lifelong). °¨ Too much and too little warfarin are both dangerous. Too much warfarin increases the risk of bleeding. Too little warfarin continues to allow the risk for blood clots. While taking warfarin, you will need to have regular blood tests to measure your blood clotting time. These blood tests usually include both the prothrombin time (PT) and international normalized ratio (INR) tests. The PT and INR results allow your health care provider to adjust your dose of warfarin. The dose can change for many reasons. It is critically important that you take warfarin exactly as prescribed, and that you have your PT and INR levels drawn exactly as directed. °¨   Many foods, especially foods high in vitamin K, can interfere with warfarin and affect the PT and INR results. Foods high in vitamin K include spinach, kale, broccoli, cabbage, collard and turnip greens, brussel sprouts, peas, cauliflower, seaweed, and parsley as well as beef and pork liver, green tea, and soybean oil. You should eat a consistent amount of foods high in vitamin K. Avoid major changes in your diet, or notify your health care provider before changing your diet. Arrange a visit with a dietitian to answer your questions. °¨ Many medicines can interfere with warfarin and affect the PT and INR results. You must tell your health care provider about any and all medicines you take. This includes all vitamins and supplements. Be especially cautious with aspirin and  anti-inflammatory medicines. Ask your health care provider before taking these. Do not take or discontinue any prescribed or over-the-counter medicine except on the advice of your health care provider or pharmacist. °¨ Warfarin can have side effects, primarily excessive bruising or bleeding. You will need to hold pressure over cuts for longer than usual. Your health care provider or pharmacist will discuss other potential side effects. °¨ Alcohol can change the body's ability to handle warfarin. It is best to avoid alcoholic drinks or consume only very small amounts while taking warfarin. Notify your health care provider if you change your alcohol intake. °¨ Notify your dentist or other health care providers before procedures. °· Activity. Ask your health care provider how soon you can go back to normal activities. It is important to stay active to prevent blood clots. If you are on anticoagulant medicine, avoid contact sports. °· Exercise. It is very important to exercise. This is especially important while traveling, sitting, or standing for long periods of time. Exercise your legs by walking or by pumping the muscles frequently. Take frequent walks. °· Compression stockings. These are tight elastic stockings that apply pressure to the lower legs. This pressure can help keep the blood in the legs from clotting. You may need to wear compression stockings at home to help prevent a DVT. °· Do not smoke. If you smoke, quit. Ask your health care provider for help with quitting smoking. °· Learn as much as you can about DVT. Knowing more about the condition should help you keep it from coming back. °· Wear a medical alert bracelet or carry a medical alert card. °SEEK MEDICAL CARE IF: °· You notice a rapid heartbeat. °· You feel weaker or more tired than usual. °· You feel faint. °· You notice increased bruising. °· You feel your symptoms are not getting better in the time expected. °· You believe you are having side  effects of medicine. °SEEK IMMEDIATE MEDICAL CARE IF: °· You have chest pain. °· You have trouble breathing. °· You have new or increased swelling or pain in one leg. °· You cough up blood. °· You notice blood in vomit, in a bowel movement, or in urine. °MAKE SURE YOU: °· Understand these instructions. °· Will watch your condition. °· Will get help right away if you are not doing well or get worse. °Document Released: 07/25/2005 Document Revised: 05/15/2013 Document Reviewed: 04/01/2013 °[employer]® Patient Information ©2015 [employer]. This information is not intended to replace advice given to you by your health care provider. Make sure you discuss any questions you have with your health care provider. ° °

## 2014-02-14 ENCOUNTER — Ambulatory Visit: Payer: Self-pay

## 2014-03-05 ENCOUNTER — Emergency Department (HOSPITAL_COMMUNITY)
Admission: EM | Admit: 2014-03-05 | Discharge: 2014-03-05 | Disposition: A | Discharge status: Home / Self Care | Payer: Self-pay | Attending: Emergency Medicine | Admitting: Emergency Medicine

## 2014-03-05 ENCOUNTER — Encounter (HOSPITAL_COMMUNITY): Payer: Self-pay | Admitting: Emergency Medicine

## 2014-03-05 DIAGNOSIS — Z79899 Other long term (current) drug therapy: Secondary | ICD-10-CM | POA: Insufficient documentation

## 2014-03-05 DIAGNOSIS — Z86718 Personal history of other venous thrombosis and embolism: Secondary | ICD-10-CM | POA: Insufficient documentation

## 2014-03-05 DIAGNOSIS — R231 Pallor: Secondary | ICD-10-CM | POA: Insufficient documentation

## 2014-03-05 DIAGNOSIS — M79673 Pain in unspecified foot: Secondary | ICD-10-CM

## 2014-03-05 DIAGNOSIS — Z86711 Personal history of pulmonary embolism: Secondary | ICD-10-CM | POA: Insufficient documentation

## 2014-03-05 DIAGNOSIS — Z7901 Long term (current) use of anticoagulants: Secondary | ICD-10-CM | POA: Insufficient documentation

## 2014-03-05 DIAGNOSIS — M25579 Pain in unspecified ankle and joints of unspecified foot: Secondary | ICD-10-CM | POA: Insufficient documentation

## 2014-03-05 NOTE — ED Provider Notes (Signed)
Medical screening examination/treatment/procedure(s) were performed by non-physician practitioner and as supervising physician I was immediately available for consultation/collaboration.  Mariette Cowley L Armanie Ullmer, MD 03/05/14 2354 

## 2014-03-05 NOTE — ED Notes (Signed)
Pt c/o bilateral foot pain, sts it has been going on for a while and has become worse d/t increase in walking lately. sts yesterday he was caught out in the rain and felt like he was getting a blister so the pain increased to left foot. Denies leg/calf pain.

## 2014-03-05 NOTE — Discharge Instructions (Signed)
Please read and follow all provided instructions.  Your diagnoses today include:  1. Foot pain, unspecified laterality     Tests performed today include:  Vital signs. See below for your results today.   Medications prescribed:   None  Take any prescribed medications only as directed.   Home care instructions:   Keep your feet dry and warm.   Keep your feet elevated when you are not walking around.   Follow-up instructions: Please follow-up with your primary care provider in the next 1 week for further evaluation of your symptoms.   Return instructions:  Return to the Emergency Department if you have:  Fever  Worsening symptoms  Worsening pain  Worsening swelling  Redness of the skin that moves away from the affected area, especially if it streaks away from the affected area   Any other emergent concerns  Your vital signs today were: BP 115/66   Pulse 67   Temp(Src) 98.3 F (36.8 C) (Oral)   Resp 17   Ht 6\' 1"  (1.854 m)   Wt 157 lb (71.215 kg)   BMI 20.72 kg/m2   SpO2 100% If your blood pressure (BP) was elevated above 135/85 this visit, please have this repeated by your doctor within one month. --------------

## 2014-03-05 NOTE — ED Provider Notes (Signed)
CSN: [id number]     Arrival date & time 03/05/14  1352 History  This chart was scribed for non-physician practitioner, Renne CriglerJoshua Zyniah Ferraiolo, PA-C, working with Flint MelterElliott L Wentz, MD by Charline BillsEssence Howell, ED Scribe. This patient was seen in room TR09C/TR09C and the patient's care was started at 3:45 PM.   Chief Complaint  Patient presents with  . Foot Pain   The history is provided by the patient. No language interpreter was used.   HPI Comments: Jim Hawkins is a 43 y.o. male, with a h/o DVT and PE, who presents to the Emergency Department complaining of gradually worsening bilateral foot pain that he attributes to an increase in walking. Pt states that he was walking in the rain yesterday and felt like he was getting a blister on the bottom of his L foot. His socks were wet and he didn't take them off -- continued walking with wet feet. Pt currently takes Coumadin.  Past Medical History  Diagnosis Date  . DVT (deep venous thrombosis)   . Pulmonary embolism    Past Surgical History  Procedure Laterality Date  . Green filter     No family history on file. History  Substance Use Topics  . Smoking status: Never Smoker   . Smokeless tobacco: Not on file  . Alcohol Use: No    Review of Systems  Constitutional: Negative for fever.  Gastrointestinal: Negative for nausea and vomiting.  Musculoskeletal: Positive for myalgias.  Skin: Positive for pallor. Negative for color change, rash and wound.   Allergies  Naproxen  Home Medications   Prior to Admission medications   Medication Sig Start Date End Date Taking? Authorizing Provider  methocarbamol (ROBAXIN) 500 MG tablet Take 500 mg by mouth 4 (four) times daily.   Yes Historical Provider, MD  warfarin (COUMADIN) 6 MG tablet Take 6 mg by mouth daily.   Yes Historical Provider, MD   Triage Vitals: BP 115/66  Pulse 67  Temp(Src) 98.3 F (36.8 C) (Oral)  Resp 17  Ht 6\' 1"  (1.854 m)  Wt 157 lb (71.215 kg)  BMI 20.72 kg/m2  SpO2  100%  Physical Exam  Nursing note and vitals reviewed. Constitutional: He appears well-developed and well-nourished.  HENT:  Head: Normocephalic and atraumatic.  Eyes: Conjunctivae are normal.  Neck: Normal range of motion. Neck supple.  Cardiovascular:  Pulses:      Dorsalis pedis pulses are 2+ on the right side, and 2+ on the left side.  Pulmonary/Chest: No respiratory distress.  Musculoskeletal: He exhibits no edema and no tenderness.  Bilateral soles are emaciated due to excessive moisture. There are no signs of cellulitis or infection. Patient with sensation intact in tips of toes. He has normal capillary refill and normal movement (with exception: pt with h/o 'paralyzed toe' since childhood).   Neurological: He is alert.  Skin: Skin is warm and dry.  Psychiatric: He has a normal mood and affect.   ED Course  Procedures (including critical care time) DIAGNOSTIC STUDIES: Oxygen Saturation is 100% on RA, normal by my interpretation.    COORDINATION OF CARE: 3:51 PM-Discussed treatment plan which includes Tylenol and return precautions with pt at bedside and pt agreed to plan.   Labs Review Labs Reviewed - No data to display  Imaging Review No results found.   EKG Interpretation None      Vital signs reviewed and are as follows: Filed Vitals:   03/05/14 1614  BP: 111/97  Pulse: 73  Temp: 98 F (  36.7 C)  Resp:    Patient was given warm socks and washed his feet. We discussed keeping his feet warm and dry. We discussed looking for signs of infection: Pt urged to return with worsening pain, worsening swelling, expanding area of redness or streaking up extremity, fever, or any other concerns. Pt verbalizes understanding and agrees with plan.   MDM   Final diagnoses:  Foot pain, unspecified laterality   Patient with bilateral foot pain. Skin is emaciated due to excessive exposure to moisture. There are no signs of frostbite/nip. There is no sign of vascular  compromise. Discussed foot care and s/s to return.   No dangerous or life-threatening conditions suspected or identified by history, physical exam, and by work-up. No indications for hospitalization identified.    I personally performed the services described in this documentation, which was scribed in my presence. The recorded information has been reviewed and is accurate.    Renne Crigler, PA-C 03/05/14 [phone]

## 2014-03-05 NOTE — ED Notes (Signed)
I gave the patient a pair of blue bariatic socks.

## 2014-03-05 NOTE — ED Notes (Signed)
I gave the patient a happy meal, a sprite, and 2 packs of graham crackers.

## 2014-03-12 ENCOUNTER — Encounter (HOSPITAL_COMMUNITY): Payer: Self-pay | Admitting: Emergency Medicine

## 2014-03-12 DIAGNOSIS — Z5900 Homelessness unspecified: Secondary | ICD-10-CM | POA: Insufficient documentation

## 2014-03-12 DIAGNOSIS — Z86711 Personal history of pulmonary embolism: Secondary | ICD-10-CM | POA: Insufficient documentation

## 2014-03-12 DIAGNOSIS — Z8659 Personal history of other mental and behavioral disorders: Secondary | ICD-10-CM | POA: Insufficient documentation

## 2014-03-12 DIAGNOSIS — F1210 Cannabis abuse, uncomplicated: Secondary | ICD-10-CM | POA: Insufficient documentation

## 2014-03-12 DIAGNOSIS — Z86718 Personal history of other venous thrombosis and embolism: Secondary | ICD-10-CM | POA: Insufficient documentation

## 2014-03-12 DIAGNOSIS — R45851 Suicidal ideations: Secondary | ICD-10-CM | POA: Insufficient documentation

## 2014-03-12 NOTE — ED Notes (Signed)
Pt. wearing paper scrubs / wanded by security at triage .

## 2014-03-12 NOTE — ED Notes (Signed)
[facility]  Press photographerCharge nurse notified for [employer]s sitter .

## 2014-03-12 NOTE — ED Notes (Signed)
Pt. reports depression /suicidal ideation , pt. did not disclose plan of suicide , denies hallucinations .

## 2014-03-13 ENCOUNTER — Emergency Department (HOSPITAL_COMMUNITY)
Admission: EM | Admit: 2014-03-13 | Discharge: 2014-03-14 | Disposition: A | Discharge status: Other Acute Inpatient Hospital | Payer: Self-pay | Attending: Emergency Medicine | Admitting: Emergency Medicine

## 2014-03-13 DIAGNOSIS — R45851 Suicidal ideations: Secondary | ICD-10-CM

## 2014-03-13 HISTORY — DX: Homelessness: Z59.0

## 2014-03-13 HISTORY — DX: Depression, unspecified: F32.A

## 2014-03-13 HISTORY — DX: Major depressive disorder, single episode, unspecified: F32.9

## 2014-03-13 HISTORY — DX: Bipolar disorder, unspecified: F31.9

## 2014-03-13 HISTORY — DX: Homelessness unspecified: Z59.00

## 2014-03-13 LAB — COMPREHENSIVE METABOLIC PANEL
ALBUMIN: 4.5 g/dL (ref 3.5–5.2)
ALK PHOS: 70 U/L (ref 39–117)
ALT: 15 U/L (ref 0–53)
ANION GAP: 12 (ref 5–15)
AST: 26 U/L (ref 0–37)
BILIRUBIN TOTAL: 0.3 mg/dL (ref 0.3–1.2)
BUN: 14 mg/dL (ref 6–23)
CHLORIDE: 100 meq/L (ref 96–112)
CO2: 27 mEq/L (ref 19–32)
Calcium: 9.3 mg/dL (ref 8.4–10.5)
Creatinine, Ser: 1.1 mg/dL (ref 0.50–1.35)
GFR calc Af Amer: >90 mL/min (ref >90)
GFR calc non Af Amer: 81 mL/min — ABNORMAL LOW (ref >90)
Glucose, Bld: 82 mg/dL (ref 70–99)
POTASSIUM: 4.2 meq/L (ref 3.7–5.3)
Sodium: 139 mEq/L (ref 137–147)
Total Protein: 8.1 g/dL (ref 6.0–8.3)

## 2014-03-13 LAB — CBC WITH DIFFERENTIAL/PLATELET
Basophils Absolute: 0 10*3/uL (ref 0.0–0.1)
Basophils Relative: 1 % (ref 0–1)
Eosinophils Absolute: 0.6 10*3/uL (ref 0.0–0.7)
Eosinophils Relative: 10 % — ABNORMAL HIGH (ref 0–5)
HCT: 39.6 % (ref 39.0–52.0)
HEMOGLOBIN: 13 g/dL (ref 13.0–17.0)
Lymphocytes Relative: 35 % (ref 12–46)
Lymphs Abs: 1.8 10*3/uL (ref 0.7–4.0)
MCH: 30.4 pg (ref 26.0–34.0)
MCHC: 32.8 g/dL (ref 30.0–36.0)
MCV: 92.5 fL (ref 78.0–100.0)
Monocytes Absolute: 0.5 10*3/uL (ref 0.1–1.0)
Monocytes Relative: 10 % (ref 3–12)
NEUTROS ABS: 2.4 10*3/uL (ref 1.7–7.7)
NEUTROS PCT: 44 % (ref 43–77)
Platelets: 188 10*3/uL (ref 150–400)
RBC: 4.28 MIL/uL (ref 4.22–5.81)
RDW: 13.6 % (ref 11.5–15.5)
WBC: 5.3 10*3/uL (ref 4.0–10.5)

## 2014-03-13 LAB — RAPID URINE DRUG SCREEN, HOSP PERFORMED
Amphetamines: NOT DETECTED
BARBITURATES: NOT DETECTED
Benzodiazepines: NOT DETECTED
COCAINE: NOT DETECTED
Opiates: NOT DETECTED
TETRAHYDROCANNABINOL: POSITIVE — AB

## 2014-03-13 LAB — ETHANOL: Alcohol, Ethyl (B): <11 mg/dL (ref 0–11)

## 2014-03-13 LAB — PROTIME-INR
INR: 1.12 (ref 0.00–1.49)
Prothrombin Time: 14.4 seconds (ref 11.6–15.2)

## 2014-03-13 MED ORDER — ALUM & MAG HYDROXIDE-SIMETH 200-200-20 MG/5ML PO SUSP: 30.0000 mL | ORAL | Status: DC | PRN

## 2014-03-13 MED ORDER — ZOLPIDEM TARTRATE 5 MG PO TABS: 5.0000 mg | ORAL_TABLET | Freq: Every evening | ORAL | Status: DC | PRN

## 2014-03-13 MED ORDER — ONDANSETRON HCL 4 MG PO TABS: 4.0000 mg | ORAL_TABLET | Freq: Three times a day (TID) | ORAL | Status: DC | PRN

## 2014-03-13 MED ORDER — WARFARIN - PHARMACIST DOSING INPATIENT: Freq: Every day | Status: DC

## 2014-03-13 MED ORDER — CLOTRIMAZOLE 1 % EX CREA
TOPICAL_CREAM | Freq: Two times a day (BID) | CUTANEOUS | Status: DC
  Administered 2014-03-13: 10:00:00 via TOPICAL
  Administered 2014-03-13: 1 via TOPICAL
  Administered 2014-03-13: 05:00:00 via TOPICAL
  Filled 2014-03-13: qty 15

## 2014-03-13 MED ORDER — ACETAMINOPHEN 325 MG PO TABS: 650.0000 mg | ORAL_TABLET | ORAL | Status: DC | PRN

## 2014-03-13 MED ORDER — WARFARIN SODIUM 7.5 MG PO TABS
7.5000 mg | ORAL_TABLET | Freq: Once | ORAL | Status: AC
  Administered 2014-03-13: 7.5 mg via ORAL
  Filled 2014-03-13: qty 1

## 2014-03-13 MED ORDER — LORAZEPAM 1 MG PO TABS
1.0000 mg | ORAL_TABLET | Freq: Three times a day (TID) | ORAL | Status: DC | PRN
  Administered 2014-03-14: 1 mg via ORAL
  Filled 2014-03-13: qty 1

## 2014-03-13 NOTE — ED Notes (Signed)
MD at bedside. 

## 2014-03-13 NOTE — ED Notes (Signed)
EDP MADE AWARE OF PT INR AND ORDERS PLACED FOR COUMADIN
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
## 2014-03-13 NOTE — ED Notes (Signed)
Pt [place] [place] [place] with no friends, family, nor a job. Pt states he has hx bipolar and can not afford meds. Pt does not know length of time he has been off meds. Pt states he is in manic state and needs to talk to someone. Pt states he is suicidal but does not have a specific plan in place states just whatever presents itself as opportunity. Pt states he is homeless and has been living on the streets. No family involvement. Pt c/o athletes feet bilaterally with pain of 10/10; pt states it hurts to stand and walk. Was seen her last week for feet but no Rx given at that time.

## 2014-03-13 NOTE — ED Provider Notes (Signed)
CSN: [id number]     Arrival date & time 03/12/14  2305 History   First MD Initiated Contact with Patient 03/13/14 0158     Chief Complaint  Patient presents with  . Suicidal     (Consider location/radiation/quality/duration/timing/severity/associated sxs/prior Treatment) HPI Pt presenting with c/o feeling suicidal.  Pt states he has been feeling manic and not taking his medications.  He states he has not had medications for approx one month.  He states he feels that he is under a lot of stress and is not able to deal with it.  He does not elaborate on a specific plan for suicide but states he would not feel safe leaving.  No recent fever/chills,  No chest pain or abdominal pain.  No vomiting.  There are no other associated systemic symptoms, there are no other alleviating or modifying factors.   Past Medical History  Diagnosis Date  . DVT (deep venous thrombosis)   . Pulmonary embolism   . Homelessness   . Depression   . Bipolar 1 disorder    Past Surgical History  Procedure Laterality Date  . Green filter     No family history on file. History  Substance Use Topics  . Smoking status: Never Smoker   . Smokeless tobacco: Not on file  . Alcohol Use: No    Review of Systems ROS reviewed and all otherwise negative except for mentioned in HPI    Allergies  Naproxen  Home Medications   Prior to Admission medications   Not on File   BP 120/69  Pulse 68  Temp(Src) 98.2 F (36.8 C) (Oral)  Resp 18  SpO2 99% Vitals reviewed Physical Exam Physical Examination: General appearance - alert, well appearing, and in no distress Mental status - alert, oriented to person, place, and time Eyes - no conjunctival injection, no scleral icterus Mouth - mucous membranes moist, pharynx normal without lesions Chest - clear to auscultation, no wheezes, rales or rhonchi, symmetric air entry Heart - normal rate, regular rhythm, normal S1, S2, no murmurs, rubs, clicks or  gallops Extremities - peripheral pulses normal, no pedal edema, no clubbing or cyanosis Skin - normal coloration and turgor, no rashes, no suspicious skin lesions noted Psych- flat affect, calm and cooperative  ED Course  Procedures (including critical care time)  3:28 AM d/w TTS who will evaluate the patient.   3:51 AM per TTS patient does meet inpatient criteria, there are no beds available and they will seek placement for him Labs Review Labs Reviewed  URINE RAPID DRUG SCREEN (HOSP PERFORMED) - Abnormal; Notable for the following:    Tetrahydrocannabinol POSITIVE (*)    All other components within normal limits  CBC WITH DIFFERENTIAL - Abnormal; Notable for the following:    Eosinophils Relative 10 (*)    All other components within normal limits  COMPREHENSIVE METABOLIC PANEL - Abnormal; Notable for the following:    GFR calc non Af Amer 81 (*)    All other components within normal limits  ETHANOL    Imaging Review No results found.   EKG Interpretation None      MDM   Final diagnoses:  Suicidal ideation    Pt with hx of bipolar disorder who has been feeling suicidal.  He states he has been off his meds over the past month.  Pt is medically cleared, pysch holding orders written.  Pt has been seen by TTS and they states he meets inpatient criteria.  They are working on placement  for him.      Ethelda Chick, MD 03/13/14 [phone]

## 2014-03-13 NOTE — BH Assessment (Signed)
Assessment completed. Consulted with Donell SievertSpencer Simon, PA-C who recommended inpatient treatment. [facility] at capacity. TTS will contact other facilities for placement. Dr. Karma GanjaLinker has been notified of the recommendation.

## 2014-03-13 NOTE — ED Notes (Signed)
Pt on his TTS assessment at this time.

## 2014-03-13 NOTE — BH Assessment (Signed)
Received a call for a tele-assessment. Spoke with Dr. Karma GanjaLinker who reported that patient has a history of bipolar and has been off of his medication for one month. Pt also reported that he is manic and feeling suicidal. Tele-assessment will be initiated.

## 2014-03-13 NOTE — BH Assessment (Addendum)
[facility] Assessment Progress Note   The following facilities have been contacted to seek placement for this pt, with results as noted:  The following facilities are at capacity:  [facility], per [place]vin at 14:14 [facility], per Toniann FailWendy at 14:24 [facility], per Angelique at 14:31 [facility], per Herbert SetaHeather at 14:33 [facility], per Tacey RuizLeah at 18:04  Messages were left on voice mail at the following facilities, with no return call:  *[facility] at 13:07 [facility] at 14:17  At 13:03 Christiane HaJonathan at [facility] reports that they do not have an appropriate bed for this pt. At 13:10 Shanda [facility]umpsJessica at [facility] reports that they do not have an appropriate bed for this pt.  At 18:20 [facility] at [facility] reports that they have beds available; referral was faxed; at 18:59 Cordelia PenSherry confirms receipt.  Doylene Canninghomas Brendan Gadson, MA Triage Specialist 03/13/2014 @ 18:59

## 2014-03-13 NOTE — Progress Notes (Signed)
ANTICOAGULATION CONSULT NOTE - Initial Consult  Pharmacy Consult for warfarin Indication: history of DVT  Allergies  Allergen Reactions  . Naproxen     Made patient feel like he was going to pass out.    Patient Measurements:     Vital Signs: Temp: 98.3 F (36.8 C) (08/06 1209) Temp src: Oral (08/06 1209) BP: 116/69 mmHg (08/06 1209) Pulse Rate: 64 (08/06 1209)  Labs:  Recent Labs  03/13/14 0005 03/13/14 1211  HGB 13.0  --   HCT 39.6  --   PLT 188  --   LABPROT  --  14.4  INR  --  1.12  CREATININE 1.10  --     The CrCl is unknown because both a height and weight (above a minimum accepted value) are required for this calculation.   Medical History: Past Medical History  Diagnosis Date  . DVT (deep venous thrombosis)   . Pulmonary embolism   . Homelessness   . Depression   . Bipolar 1 disorder      Assessment: Jim Hawkins YOM seen here with suicidal ideation. On warfarin for history of DVT/PE, however note patient is homeless and has been unable to afford medications. States he has not taken medications in ~1 month. INR low at 1.12 as expected. CBC is normal. No bleeding  Per old notes, last known warfarin regimen was 6mg  daily.  Goal of Therapy:  INR 2-3 Monitor platelets by anticoagulation protocol: Yes   Plan:  1. Warfarin 7.5mg  po x1 tonight 2. Daily PT/INR 3. Follow for s/s bleeding and placement  Allessandra Bernardi D. Solomiya Pascale, PharmD, BCPS Clinical Pharmacist Pager: [phone] 03/13/2014 2:07 PM

## 2014-03-14 ENCOUNTER — Inpatient Hospital Stay (HOSPITAL_COMMUNITY)
Admission: AD | Admit: 2014-03-14 | Discharge: 2014-03-19 | DRG: 885 | Disposition: A | Discharge status: Home / Self Care | Payer: No Typology Code available for payment source | Source: Intra-hospital | Attending: Emergency Medicine | Admitting: Emergency Medicine

## 2014-03-14 ENCOUNTER — Encounter (HOSPITAL_COMMUNITY): Payer: Self-pay | Admitting: *Deleted

## 2014-03-14 DIAGNOSIS — F4310 Post-traumatic stress disorder, unspecified: Secondary | ICD-10-CM | POA: Diagnosis present

## 2014-03-14 DIAGNOSIS — Z86718 Personal history of other venous thrombosis and embolism: Secondary | ICD-10-CM | POA: Diagnosis not present

## 2014-03-14 DIAGNOSIS — R45851 Suicidal ideations: Secondary | ICD-10-CM

## 2014-03-14 DIAGNOSIS — M79609 Pain in unspecified limb: Secondary | ICD-10-CM | POA: Diagnosis present

## 2014-03-14 DIAGNOSIS — Z5900 Homelessness unspecified: Secondary | ICD-10-CM | POA: Diagnosis not present

## 2014-03-14 DIAGNOSIS — F329 Major depressive disorder, single episode, unspecified: Secondary | ICD-10-CM | POA: Diagnosis present

## 2014-03-14 DIAGNOSIS — Z86711 Personal history of pulmonary embolism: Secondary | ICD-10-CM | POA: Diagnosis not present

## 2014-03-14 DIAGNOSIS — Z598 Other problems related to housing and economic circumstances: Secondary | ICD-10-CM

## 2014-03-14 DIAGNOSIS — F17200 Nicotine dependence, unspecified, uncomplicated: Secondary | ICD-10-CM | POA: Diagnosis present

## 2014-03-14 DIAGNOSIS — F1210 Cannabis abuse, uncomplicated: Secondary | ICD-10-CM | POA: Diagnosis present

## 2014-03-14 DIAGNOSIS — F410 Panic disorder [episodic paroxysmal anxiety] without agoraphobia: Secondary | ICD-10-CM | POA: Diagnosis present

## 2014-03-14 DIAGNOSIS — I82403 Acute embolism and thrombosis of unspecified deep veins of lower extremity, bilateral: Secondary | ICD-10-CM

## 2014-03-14 DIAGNOSIS — IMO0001 Reserved for inherently not codable concepts without codable children: Secondary | ICD-10-CM | POA: Diagnosis present

## 2014-03-14 DIAGNOSIS — G4700 Insomnia, unspecified: Secondary | ICD-10-CM | POA: Diagnosis present

## 2014-03-14 DIAGNOSIS — F319 Bipolar disorder, unspecified: Secondary | ICD-10-CM | POA: Diagnosis present

## 2014-03-14 DIAGNOSIS — Z7901 Long term (current) use of anticoagulants: Secondary | ICD-10-CM

## 2014-03-14 DIAGNOSIS — Z5987 Material hardship due to limited financial resources, not elsewhere classified: Secondary | ICD-10-CM

## 2014-03-14 DIAGNOSIS — R454 Irritability and anger: Secondary | ICD-10-CM

## 2014-03-14 DIAGNOSIS — M79652 Pain in left thigh: Secondary | ICD-10-CM

## 2014-03-14 DIAGNOSIS — Z5989 Other problems related to housing and economic circumstances: Secondary | ICD-10-CM | POA: Diagnosis not present

## 2014-03-14 DIAGNOSIS — F411 Generalized anxiety disorder: Secondary | ICD-10-CM | POA: Diagnosis present

## 2014-03-14 DIAGNOSIS — F322 Major depressive disorder, single episode, severe without psychotic features: Secondary | ICD-10-CM

## 2014-03-14 LAB — PROTIME-INR
INR: 0.98 (ref 0.00–1.49)
Prothrombin Time: 13 seconds (ref 11.6–15.2)

## 2014-03-14 MED ORDER — TRAZODONE HCL 50 MG PO TABS
50.0000 mg | ORAL_TABLET | Freq: Every evening | ORAL | Status: DC | PRN
  Filled 2014-03-14: qty 14

## 2014-03-14 MED ORDER — MAGNESIUM HYDROXIDE 400 MG/5ML PO SUSP: 30.0000 mL | Freq: Every day | ORAL | Status: DC | PRN

## 2014-03-14 MED ORDER — WARFARIN SODIUM 10 MG PO TABS
10.0000 mg | ORAL_TABLET | Freq: Once | ORAL | Status: AC
  Administered 2014-03-14: 10 mg via ORAL
  Filled 2014-03-14: qty 1

## 2014-03-14 MED ORDER — ACETAMINOPHEN 325 MG PO TABS
650.0000 mg | ORAL_TABLET | Freq: Four times a day (QID) | ORAL | Status: DC | PRN
Start: 2014-03-14 — End: 2014-03-19
  Administered 2014-03-18: 650 mg via ORAL
  Filled 2014-03-14: qty 2

## 2014-03-14 MED ORDER — ALUM & MAG HYDROXIDE-SIMETH 200-200-20 MG/5ML PO SUSP
30.0000 mL | ORAL | Status: DC | PRN
Start: 2014-03-14 — End: 2014-03-19

## 2014-03-14 NOTE — ED Notes (Signed)
Pt being admitted to [facility] rm [id number], Dr. Vernie Murdersobas accepting.

## 2014-03-14 NOTE — ED Notes (Signed)
Called [facility] to check on possible placement-- [facility] Will check and return call.

## 2014-03-14 NOTE — Progress Notes (Signed)
ANTICOAGULATION CONSULT NOTE - Follow Up Consult  Pharmacy Consult for Warfarin Indication: History of DVT  Allergies  Allergen Reactions  . Naproxen     Made patient feel like he was going to pass out.    Patient Measurements:     Vital Signs: Temp: 98.5 F (36.9 C) (08/07 1019) Temp src: Oral (08/07 1019) BP: 115/68 mmHg (08/07 1019) Pulse Rate: 64 (08/07 1019)  Labs:  Recent Labs  03/13/14 0005 03/13/14 1211 03/14/14 0500  HGB 13.0  --   --   HCT 39.6  --   --   PLT 188  --   --   LABPROT  --  14.4 13.0  INR  --  1.12 0.98  CREATININE 1.10  --   --     The CrCl is unknown because both a height and weight (above a minimum accepted value) are required for this calculation.   Medications:  Scheduled:  . clotrimazole   Topical BID  . Warfarin - Pharmacist Dosing Inpatient   Does not apply q1800    Assessment: 43 year old male currently awaiting inpatient placement for suicidal ideation. He has not taken his medications for approximately 1 month, including warfarin for history of DVT.  Warfarin was restarted 8/6 and his INR remains subtherapuetic as would be expected.  Goal of Therapy:  INR 2-3   Plan:  Warfarin 10mg  today Daily PT/INR monitoring  Estella HuskMichelle Evoleth Nordmeyer, Pharm.D., BCPS, AAHIVP Clinical Pharmacist Phone: [phone] or [phone] 03/14/2014, 10:45 AM

## 2014-03-14 NOTE — Progress Notes (Signed)
43 year old male pt admitted on voluntary basis. Pt reports that he has been having suicidal thoughts recently and feels that he is experiencing a manic episode. Pt reports that he has not been sleeping or eating well. Pt reports that he was on medications in the past but has been off his meds for years now. Pt reports he was on Depakote and celexa in the past that worked well for him and would like to be back on those medications. Pt reports that he is homeless and unsure of where he will go after discharge. Pt reports that he has "blood clots" and takes coumadin for this. Pt does report that [facility] [place]. Pt denies any drinking or drug usage. Pt is able to contract for safety on the unit, pt was oriented to the unit and safety maintained.

## 2014-03-14 NOTE — ED Notes (Signed)
Pt states that he feels that this is a manic episode- he said he has not been taking his meds. Was on depakote, Celexa, triavil.

## 2014-03-14 NOTE — ED Notes (Signed)
Pt signed for belongings from safe-- remain in envelope to be transported with Juel BurrowPelham

## 2014-03-15 ENCOUNTER — Encounter (HOSPITAL_COMMUNITY): Payer: Self-pay | Admitting: Registered Nurse

## 2014-03-15 DIAGNOSIS — F39 Unspecified mood [affective] disorder: Secondary | ICD-10-CM

## 2014-03-15 DIAGNOSIS — F332 Major depressive disorder, recurrent severe without psychotic features: Secondary | ICD-10-CM

## 2014-03-15 DIAGNOSIS — R45850 Homicidal ideations: Secondary | ICD-10-CM

## 2014-03-15 DIAGNOSIS — R45851 Suicidal ideations: Secondary | ICD-10-CM

## 2014-03-15 MED ORDER — WARFARIN - PHARMACIST DOSING INPATIENT
Freq: Every day | Status: DC
  Administered 2014-03-17: 15
  Filled 2014-03-15 (×12): qty 1

## 2014-03-15 MED ORDER — HYDROXYZINE HCL 25 MG PO TABS
25.0000 mg | ORAL_TABLET | Freq: Three times a day (TID) | ORAL | Status: DC | PRN
  Administered 2014-03-15 – 2014-03-18 (×2): 25 mg via ORAL
  Filled 2014-03-15 (×2): qty 1
  Filled 2014-03-15: qty 30

## 2014-03-15 MED ORDER — WARFARIN SODIUM 10 MG PO TABS
10.0000 mg | ORAL_TABLET | Freq: Once | ORAL | Status: AC
  Administered 2014-03-15: 10 mg via ORAL
  Filled 2014-03-15: qty 1

## 2014-03-15 NOTE — Progress Notes (Signed)
Psychoeducational Group Note  Date: 03/15/2014 Time:  1015  Group Topic/Focus:  Identifying Needs:   The focus of this group is to help patients identify their personal needs that have been historically problematic and identify healthy behaviors to address their needs.  Participation Level:  Active  Participation Quality:  Appropriate  Affect:  Appropriate  Cognitive:  Oriented  Insight:  Improving  Engagement in Group:  Engaged  Additional Comments:  Pt was attentive and participated. Listened attentively to others in the group.  Jim Hawkins A 

## 2014-03-15 NOTE — [facility] Suicide Risk Assessment (Signed)
   Nursing information obtained from:    Demographic factors:    Current Mental Status:    Loss Factors:    Historical Factors:    Risk Reduction Factors:    Total Time spent with patient: 45 minutes  CLINICAL FACTORS:   Depression:   Hopelessness Impulsivity Insomnia Severe More than one psychiatric diagnosis Unstable or Poor Therapeutic Relationship Previous Psychiatric Diagnoses and Treatments  Psychiatric Specialty Exam: Physical Exam  ROS  Blood pressure 114/89, pulse 94, temperature 98.5 F (36.9 C), temperature source Oral, resp. rate 16, height 6' (1.829 m), weight 153 lb (69.4 kg).Body mass index is 20.75 kg/(m^2).  General Appearance: guarded  Eye Contact::  Fair  Speech:  Normal Rate  Volume:  Normal  Mood:  Irritable  Affect:  Restricted  Thought Process:  Circumstantial  Orientation:  Full (Time, Place, and Person)  Thought Content:  Paranoid Ideation  Suicidal Thoughts:  Yes.  with intent/plan  Homicidal Thoughts:  Yes.  without intent/plan  Memory:  Immediate;   Fair Recent;   Fair Remote;   Fair  Judgement:  Impaired  Insight:  Lacking  Psychomotor Activity:  Increased  Concentration:  Fair  Recall:  [employer]Fair  Fund of Knowledge:Fair  Language: Fair  Akathisia:  No  Handed:  Right  AIMS (if indicated):     Assets:  Communication Skills Housing  Sleep:  Number of Hours: 6.25   Musculoskeletal: Strength & Muscle Tone: within normal limits Gait & Station: normal Patient leans: N/A  COGNITIVE FEATURES THAT CONTRIBUTE TO RISK:  Closed-mindedness Loss of executive function Polarized thinking Thought constriction (tunnel vision)    SUICIDE RISK:   Severe:  Frequent, intense, and enduring suicidal ideation, specific plan, no subjective intent, but some objective markers of intent (i.e., choice of lethal method), the method is accessible, some limited preparatory behavior, evidence of impaired self-control, severe dysphoria/symptomatology, multiple  risk factors present, and few if any protective factors, particularly a lack of social support.  PLAN OF CARE:  I certify that inpatient services furnished can reasonably be expected to improve the patient's condition.  Homer Miller T. 03/15/2014, 3:40 PM

## 2014-03-15 NOTE — [facility] Suicide Risk Assessment (Cosign Needed)
Suicide Risk Assessment  Admission Assessment     Nursing information obtained from:    Demographic factors:    Current Mental Status:    Loss Factors:    Historical Factors:    Risk Reduction Factors:    Total Time spent with patient: 30 minutes  CLINICAL FACTORS:   Depression:   Aggression Hopelessness Severe Alcohol/Substance Abuse/Dependencies Medical Diagnoses and Treatments/Surgeries  Psychiatric Specialty Exam:   CO   Blood pressure 114/89, pulse 94, temperature 98.5 F (36.9 C), temperature source Oral, resp. rate 16, height 6' (1.829 m), weight 69.4 kg (153 lb).Body mass index is 20.75 kg/(m^2).   General Appearance: Casual   Eye Contact:: Fair   Speech: Clear and Coherent and Normal Rate   Volume: Normal   Mood: Angry, Anxious, Depressed, Hopeless and Irritable   Affect: Blunt, Congruent, Depressed and Flat   Thought Process: Circumstantial and Loose   Orientation: Full (Time, Place, and Person)   Thought Content: Rumination   Suicidal Thoughts: Yes. with intent/plan   Homicidal Thoughts: Yes. without intent/plan   Memory: Immediate; Good  Recent; Good  Remote; Good   Judgement: Impaired   Insight: Lacking   Psychomotor Activity: Normal   Concentration: Fair   Recall: Good   Fund of Knowledge:Good   Language: Good   Akathisia: No   Handed: Right   AIMS (if indicated):   Assets: Communication Skills  Desire for Improvement  Housing   Sleep: Number of Hours: 6.25   Musculoskeletal:  Strength & Muscle Tone: within normal limits  Gait & Station: normal  Patient leans: N/a  COGNITIVE FEATURES THAT CONTRIBUTE TO RISK:    None noted  SUICIDE RISK:   Severe:  Frequent, intense, and enduring suicidal ideation, specific plan, no subjective intent, but some objective markers of intent (i.e., choice of lethal method), the method is accessible, some limited preparatory behavior, evidence of impaired self-control, severe dysphoria/symptomatology, multiple  risk factors present, and few if any protective factors, particularly a lack of social support.  PLAN OF CARE:  1. Admit for crisis management and stabilization.  2. Medication management to reduce current symptoms to base line and improve the patient's overall level of functioning:  3. Treat health problems as indicated.  4. Develop treatment plan to decrease risk of relapse upon discharge and the need for readmission.  5. Psycho-social education regarding relapse prevention and self- care.  6. Health care follow up as needed for medical problems.  7. Restart home medications where appropriate.   I certify that inpatient services furnished can reasonably be expected to improve the patient's condition.  Maddix Heinz, FNP-BC 03/15/2014, 9:47 AM

## 2014-03-15 NOTE — Progress Notes (Signed)
Psychoeducational Group Note  Date:  03/17/2014 Time:  0930  Group Topic/Focus:  Identifying Needs:   The focus of this group is to help patients identify their personal needs that have been historically problematic and identify healthy behaviors to address their needs.  Participation Level:  Active  Participation Quality: good  Affect: flat  Cognitive: intact   Insight:  good  Engagement in Group: engaged  Additional Comments:

## 2014-03-15 NOTE — Progress Notes (Signed)
ANTICOAGULATION CONSULT NOTE - Follow Up Consult  Pharmacy Consult for Coumadin  Indication: H/O DVT/PE   Allergies  Allergen Reactions  . Naproxen     Made patient feel like he was going to pass out.    Patient Measurements: Height: 6' (182.9 cm) Weight: 153 lb (69.4 kg) IBW/kg (Calculated) : 77.6   Vital Signs: Temp: 98.5 F (36.9 C) (08/08 0830) BP: 114/89 mmHg (08/08 0831) Pulse Rate: 94 (08/08 0831)  Labs:  Recent Labs  03/13/14 0005 03/13/14 1211 03/14/14 0500  HGB 13.0  --   --   HCT 39.6  --   --   PLT 188  --   --   LABPROT  --  14.4 13.0  INR  --  1.12 0.98  CREATININE 1.10  --   --     Estimated Creatinine Clearance: 85.9 ml/min (by C-G formula based on Cr of 1.1).   Medications:  Scheduled:  . warfarin  10 mg Oral ONCE-1800  . Warfarin - Pharmacist Dosing Inpatient   Does not apply q1800    Assessment: INR well below goal and not increased after 2 doses.  No problems noted.  Goal of Therapy:  INR 2-3    Plan:  Coumadin 10 mg po x 1 today PT/INR in am    Charyl Dancerayne, Mariska Daffin Marie 03/15/2014,10:09 AM

## 2014-03-15 NOTE — [facility] Group Notes (Signed)
[facility] Group Notes:  (Clinical Social Work)  03/15/2014   1:15-2:15PM  Summary of Progress/Problems:   The main focus of today's process group was for the patient to identify ways in which they have sabotaged their own mental health wellness/recovery.  Motivational interviewing and a handout were used to explore the benefits and costs of their self-sabotaging behavior as well as the benefits and costs of changing this behavior.  The Stages of Change were explained to the group using a handout, and patients identified where they are with regard to changing self-defeating behaviors.  The patient was late to group, participated lightly but was very engaged and insightful.  Type of Therapy:  Process Group  Participation Level:  Active  Participation Quality:  Attentive and Sharing  Affect:  Blunted  Cognitive:  Appropriate and Oriented  Insight:  Developing/Improving  Engagement in Therapy:  Engaged  Modes of Intervention:  Education, Motivational Interviewing   Pilgrim's PrideMareida Grossman-Orr, LCSW 03/15/2014, 4:00pm
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
## 2014-03-15 NOTE — Progress Notes (Signed)
D) Pt has been attending the program but sits quietly and listens to what is being said. States that he came in the hospital because he was feeling unsafe outside of the hospital. Having anxiety attacks and not able to truly attribute them to anything. Pt sates that he left his home is [place] in the winter due to foreclosure. 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 States the anxiety just started. Did suffer from anxiety when he was younger but it has been years. Rates his depression a t a 7, his hopelessness at an 8 and his anxiety at a 7. States "I didn't want to hurt me or anyone else, that is why I came in the hospital" A) Given support, reassurance and praise. Encouragement given. Talked with Pt about staying at the shelter and then going to unemployment to look for work. Also told about [facility]. R) Pt denies SI presently but is focused on his anxiety.
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
## 2014-03-15 NOTE — Progress Notes (Signed)
Patient ID: Jim Hawkins, male   DOB: 1970/08/10, 43 y.o.   MRN: [id number] Pt resting in bed with eyes closed. No distress noted. Will continue to monitor closely.

## 2014-03-16 DIAGNOSIS — F1910 Other psychoactive substance abuse, uncomplicated: Secondary | ICD-10-CM

## 2014-03-16 DIAGNOSIS — F341 Dysthymic disorder: Secondary | ICD-10-CM

## 2014-03-16 LAB — PROTIME-INR
INR: 1.13 (ref 0.00–1.49)
Prothrombin Time: 14.5 seconds (ref 11.6–15.2)

## 2014-03-16 MED ORDER — CITALOPRAM HYDROBROMIDE 10 MG PO TABS
10.0000 mg | ORAL_TABLET | Freq: Every day | ORAL | Status: DC
  Administered 2014-03-16 – 2014-03-17 (×2): 10 mg via ORAL
  Filled 2014-03-16 (×4): qty 1

## 2014-03-16 MED ORDER — WARFARIN SODIUM 10 MG PO TABS
10.0000 mg | ORAL_TABLET | Freq: Once | ORAL | Status: AC
  Administered 2014-03-16: 10 mg via ORAL
  Filled 2014-03-16: qty 1

## 2014-03-16 MED ORDER — METHOCARBAMOL 500 MG PO TABS: 500.0000 mg | ORAL_TABLET | Freq: Two times a day (BID) | ORAL | Status: DC | PRN

## 2014-03-16 NOTE — [facility] Group Notes (Signed)
[facility] Group Notes:  (Clinical Social Work)  03/16/2014   1:15-2:15PM  Summary of Progress/Problems:  The main focus of today's process group was to   identify the patient's current support system and decide on other supports that can be put in place.  The picture on workbook was used to discuss why additional supports are needed.  An emphasis was placed on using counselor, doctor, therapy groups, 12-step groups, and problem-specific support groups to expand supports.   There was also an extensive discussion about what constitutes a healthy support versus an unhealthy support.  The patient expressed full comprehension of the concepts presented.  He was quiet throughout group, but displayed interest in the topic and followed the discussion.  Type of Therapy:  Process Group  Participation Level:  Active  Participation Quality:  Attentive  Affect:  Blunted and Depressed  Cognitive:  Appropriate and Oriented  Insight:  Improving  Engagement in Therapy:  Engaged  Modes of Intervention:  Education,  Support and [employer]Processing  Diem Pagnotta Grossman-Orr, LCSW 03/16/2014, 4:00pm

## 2014-03-16 NOTE — Progress Notes (Signed)
Psychoeducational Group Note  Date:  03/16/2014 Time:  1015  Group Topic/Focus:  Making Healthy Choices:   The focus of this group is to help patients identify negative/unhealthy choices they were using prior to admission and identify positive/healthier coping strategies to replace them upon discharge.  Participation Level:  Active  Participation Quality:  Attentive  Affect:  Appropriate  Cognitive:  Oriented  Insight:  Improving  Engagement in Group:  Engaged  Additional Comments:  Pt attended group and participated in the discussion  Jim Hawkins A 03/16/2014  

## 2014-03-16 NOTE — Progress Notes (Signed)
Adult Psychoeducational Group Note  Date:  03/16/2014 Time:  1:10 AM  Group Topic/Focus:  Wrap-Up Group:   The focus of this group is to help patients review their daily goal of treatment and discuss progress on daily workbooks.  Participation Level:  Active  Participation Quality:  Appropriate  Affect:  Appropriate  Cognitive:  Appropriate  Insight: Appropriate  Engagement in Group:  Engaged  Modes of Intervention:  Discussion  Additional Comments:The patient expressed he learned a person cant just go on perception.The patient said that you have to have a open mind to make better decisions.  Octavio Mannshigpen, Rexann Lueras Lee 03/16/2014, 1:10 AM

## 2014-03-16 NOTE — Progress Notes (Signed)
D)Pt has attended the groups and interacts with peers. Pt rates his depression at a 7, his hopelessness at an 7 and his anxiety at an 8. Pt denies SI and HI. Pt continues to worry about being homeless and wanted very much to get some information on the chart about himself. A) Given support, reassurance and praise. Encouragement given along with praise. R) Denies SI and HI presently.

## 2014-03-16 NOTE — Progress Notes (Signed)
Psychoeducational Group Note  Date: 03/16/2014 Time:  0930  Group Topic/Focus:  Gratefulness:  The focus of this group is to help patients identify what two things they are most grateful for in their lives. What helps ground them and to center them on their work to their recovery.  Participation Level:  Active  Participation Quality:  Appropriate  Affect:  appropriate  Cognitive:  Oriented  Insight:  Improving  Engagement in Group:  Engaged  Additional Comments:  Attended the group and participated    Shuayb Schepers A   

## 2014-03-16 NOTE — Progress Notes (Signed)
[facility]  03/16/2014 10:44 AM Jim Hawkins  MRN:  [id number]  Subjective:  Patient states that he wears TED hose everyday because of blood clots.  Patient also states that he has a medication that he uses for muscle spasms as needed. I am what you call an indigenous person; I can trace my heritage back to the beginning; so don't tie group me with these people over here. I told them I need to get my papers out of my locker so they will have them for record.  They need to respect who I am.  I indigenous people and I have rights and that they need to respect.  Patient states that he is easily agitated and this morning he has tried to breath just to stay calm.  Patient went on talking about his rights, heritage and who we (the staff) could talk to get better informed about his rights.   Patient state that he woke up with a panic attack last night.  Patient states that he is not sure of what bring on the panic attacks "It must be my psyche"   Patient states that his primary doctor id Dr. Jeanann Lewandowskylugbemiga Jegede with [facility]  Diagnosis:   DSM5: Schizophrenia Disorders:  denies Obsessive-Compulsive Disorders:  denies Trauma-Stressor Disorders:  Posttraumatic Stress Disorder (309.81) Substance/Addictive Disorders:  Cannabis Use Disorder - Severe (304.30) Patient states "I smoke weed daily; it is not illegal where I'm from and it is normal. It helps me relax.  I am not going to stop using because your law say it is illegal." Depressive Disorders:  Disruptive Mood Dysregulation Disorder (296.99) and Major Depressive Disorder - Severe (296.23) Total Time spent with patient: 45 minutes  Axis I: Anxiety Disorder NOS, Depressive Disorder NOS, Mood Disorder NOS and Substance Abuse Axis II: Deferred Axis III:  Past Medical History  Diagnosis Date  . DVT (deep venous thrombosis)   . Pulmonary embolism   . Homelessness   . Depression   . Bipolar 1 disorder    Axis IV: other  psychosocial or environmental problems Axis V: 11-20 some danger of hurting self or others possible OR occasionally fails to maintain minimal personal hygiene OR gross impairment in communication  ADL's:  Intact  Sleep: Good  Appetite:  Fair  Suicidal Ideation:  Plan:  "No plan, just walk in front of car or something; multiple ways to kill yourself" Intent:  Yes Means:  Yes Homicidal Ideation:  Patient states that he is easily angered and has thoughts of hurting others "but I don't really want to hurt anybody.  I believe I would hurt myself before I would hurt somebody else" AEB (as evidenced by):  Psychiatric Specialty Exam: Physical Exam  Constitutional: He is oriented to person, place, and time.  HENT:  Head: Normocephalic.  Neck: Normal range of motion.  Respiratory: Effort normal.  Musculoskeletal: Normal range of motion.  Neurological: He is alert and oriented to person, place, and time.  Skin: Skin is warm and dry.  Psychiatric: His speech is normal. His mood appears anxious. His affect is angry. He is agitated. He exhibits a depressed mood. He expresses homicidal (Passive) and suicidal ideation. He expresses suicidal plans. He expresses no homicidal plans.    Review of Systems  HENT: Negative.   Cardiovascular:       History of DVT   Gastrointestinal: Negative for nausea, vomiting and diarrhea.  Musculoskeletal: Positive for myalgias. Negative for back pain, falls, joint pain and  neck pain.       Complaints of muscle spasm   Skin: Negative.   Psychiatric/Behavioral: Positive for depression, suicidal ideas and substance abuse. Negative for hallucinations and memory loss. The patient is nervous/anxious.     Blood pressure 120/76, pulse 72, temperature 98.1 F (36.7 C), temperature source Oral, resp. rate 20, height 6' (1.829 m), weight 69.4 kg (153 lb).Body mass index is 20.75 kg/(m^2).  General Appearance: Casual and Fairly Groomed  Eye Contact::  Good  Speech:   Clear and Coherent and Normal Rate  Volume:  Normal  Mood:  Angry, Anxious, Depressed, Hopeless and Irritable  Affect:  Blunt, Congruent and Depressed  Thought Process:  Circumstantial and Goal Directed  Orientation:  Full (Time, Place, and Person)  Thought Content:  Rumination  Suicidal Thoughts:  Yes.  with intent/plan  Homicidal Thoughts:  Yes.  without intent/plan  Memory:  Immediate;   Good Recent;   Good Remote;   Good  Judgement:  Fair  Insight:  Fair  Psychomotor Activity:  Normal  Concentration:  Fair  Recall:  Good  Fund of Knowledge:Good  Language: Good  Akathisia:  No  Handed:  Right  AIMS (if indicated):     Assets:  Communication Skills Desire for Improvement  Sleep:  Number of Hours: 6.25   Musculoskeletal: Strength & Muscle Tone: within normal limits Gait & Station: normal Patient leans: N/A  Current Medications: Current Facility-Administered Medications  Medication Dose Route Frequency Provider Last Rate Last Dose  . acetaminophen (TYLENOL) tablet 650 mg  650 mg Oral Q6H PRN Kristeen Mans, NP      . alum & mag hydroxide-simeth (MAALOX/MYLANTA) 200-200-20 MG/5ML suspension 30 mL  30 mL Oral Q4H PRN Kristeen Mans, NP      . hydrOXYzine (ATARAX/VISTARIL) tablet 25 mg  25 mg Oral TID PRN Shuvon Rankin, NP   25 mg at 03/15/14 1606  . magnesium hydroxide (MILK OF MAGNESIA) suspension 30 mL  30 mL Oral Daily PRN Kristeen Mans, NP      . traZODone (DESYREL) tablet 50 mg  50 mg Oral QHS PRN Kristeen Mans, NP      . warfarin (COUMADIN) tablet 10 mg  10 mg Oral ONCE-1800 [name], MD      . Warfarin - Pharmacist Dosing Inpatient   Does not apply [id number] [name], MD        Lab Results:  Results for orders placed during the hospital encounter of 03/14/14 (from the past 48 hour(s))  PROTIME-INR     Status: None   Collection Time    03/16/14  6:18 AM      Result Value Ref Range   Prothrombin Time 14.5  11.6 - 15.2 seconds   INR 1.13  0.00 - 1.49    Comment: Performed at [facility]    Physical Findings: AIMS: Facial and Oral Movements Muscles of Facial Expression: None, normal Lips and Perioral Area: None, normal Jaw: None, normal Tongue: None, normal,Extremity Movements Upper (arms, wrists, hands, fingers): None, normal Lower (legs, knees, ankles, toes): None, normal, Trunk Movements Neck, shoulders, hips: None, normal, Overall Severity Severity of abnormal movements (highest score from questions above): None, normal Incapacitation due to abnormal movements: None, normal Patient's awareness of abnormal movements (rate only patient's report): No Awareness, Dental Status Current problems with teeth and/or dentures?: No Does patient usually wear dentures?: No  CIWA:    COWS:     Treatment Plan Summary: Daily contact with patient  to assess and evaluate symptoms and progress in treatment Medication management 1. Daily contact with patient to assess and evaluate symptoms and progress in treatment.  2. Medication management  3. The patient will deny suicidal ideations 48 hours prior to discharge and have a depression and anxiety rating of 3 or less.  4. The patient will also deny any auditory or visual hallucinations or delusional thinking.  Celexa 10 mg daily Robaxin 500 mg bid PRN muscle spasm TED hose order bilaterally lower ext related to DVT  Plan:  Medical Decision Making Problem Points:  Established problem, worsening (2), New problem, with additional work-up planned (4), Review of last therapy session (1) and Review of psycho-social stressors (1) Data Points:  Independent review of image, tracing, or specimen (2) Review or order clinical lab tests (1) Review of medication regiment & side effects (2)  I certify that inpatient services furnished can reasonably be expected to improve the patient's condition.   Rankin, Shuvon, FNP-BC  03/16/2014, 10:44 AM  I agreed with the findings, treatment and  disposition plan of this patient. Kathryne Sharper, MD

## 2014-03-16 NOTE — Progress Notes (Signed)
Patient ID: Jim Hawkins, male   DOB: 1971/07/24, 43 y.o.   MRN: [id number] D)  Has been to the dayroom this evening for group and participated.  Has been pleasant, cooperative, appreciative.  Talked about group earlier today, and about having an open mind and learning.  Stated leg is feeling better, not as much swelling as he had expected.  Denies thoughts of self harm. A)  Support, encouragement, continue to monitor for safety,R)  Safety maintained.

## 2014-03-16 NOTE — Progress Notes (Signed)
ANTICOAGULATION CONSULT NOTE - Follow Up Consult  Pharmacy Consult for Coumadin Indication:H/O dvt/PE  Allergies  Allergen Reactions  . Naproxen     Made patient feel like he was going to pass out.    Patient Measurements: Height: 6' (182.9 cm) Weight: 153 lb (69.4 kg) IBW/kg (Calculated) : 77.6    Labs:  Recent Labs  03/13/14 1211 03/14/14 0500 03/16/14 0618  LABPROT 14.4 13.0 14.5  INR 1.12 0.98 1.13    Estimated Creatinine Clearance: 85.9 ml/min (by C-G formula based on Cr of 1.1).   Medications:  Scheduled:  . warfarin  10 mg Oral ONCE-1800  . Warfarin - Pharmacist Dosing Inpatient   Does not apply q1800    Assessment: INR below goal.  Slight increase.  No problems noted  Goal of Therapy:  INR 2-3    Plan:  Coumadin 10 mg x 1 today at 1800 PT/INR in AM    Peggye FothergillPayne, Shell Yandow Marie 03/16/2014,8:02 AM

## 2014-03-17 DIAGNOSIS — F411 Generalized anxiety disorder: Secondary | ICD-10-CM

## 2014-03-17 LAB — PROTIME-INR
INR: 1.11 (ref 0.00–1.49)
PROTHROMBIN TIME: 14.3 s (ref 11.6–15.2)

## 2014-03-17 MED ORDER — WARFARIN SODIUM 7.5 MG PO TABS
15.0000 mg | ORAL_TABLET | Freq: Once | ORAL | Status: AC
  Administered 2014-03-17: 15 mg via ORAL
  Filled 2014-03-17: qty 2

## 2014-03-17 MED ORDER — CITALOPRAM HYDROBROMIDE 20 MG PO TABS
20.0000 mg | ORAL_TABLET | Freq: Every day | ORAL | Status: DC
  Administered 2014-03-18 – 2014-03-19 (×2): 20 mg via ORAL
  Filled 2014-03-17 (×4): qty 1

## 2014-03-17 MED ORDER — ENSURE COMPLETE PO LIQD
237.0000 mL | Freq: Two times a day (BID) | ORAL | Status: DC
  Administered 2014-03-17 – 2014-03-18 (×3): 237 mL via ORAL

## 2014-03-17 NOTE — Clinical Social Work Note (Signed)
Patient provided with information on emergency housing resources and encouraged to contact facilities during his stay. Patient agreeable, has no further questions.   Samuella BruinKristin Edwin Baines, MSW, [employer]CSWA Clinical Social Worker [facility] [phone]

## 2014-03-17 NOTE — Progress Notes (Signed)
Patient ID: Jim Hawkins, male   DOB: 12-06-1970, 43 y.o.   MRN: [id number] D)  Was quiet and sitting in his room this evening but attended group.   Affect is brighter this evening, A)  Will continue to monitor for safety, support, encouragement. States is feeling better, denies pain or discomfort,  Denies thoughts of self harm.  Unclear about plan for discharge, but states he is already feeling better now that he is back on his meds.   R)  Safety maintained.

## 2014-03-17 NOTE — [facility] Group Notes (Signed)
[facility] LCSW Group Therapy 03/17/2014  1:15 pm  Type of Therapy: Group Therapy Participation Level: Active  Participation Quality: Attentive, Sharing and Supportive  Affect: Depressed and Flat  Cognitive: Alert and Oriented  Insight: Developing/Improving and Engaged  Engagement in Therapy: Developing/Improving and Engaged  Modes of Intervention: Clarification, Confrontation, Discussion, Education, Exploration,  Limit-setting, Orientation, Problem-solving, Rapport Building, Dance movement psychotherapisteality Testing, Socialization and Support  Summary of Progress/Problems: Pt identified obstacles faced currently and processed barriers involved in overcoming these obstacles. Pt identified steps necessary for overcoming these obstacles and explored motivation (internal and external) for facing these difficulties head on. Pt further identified one area of concern in their lives and chose a goal to focus on for today. Patient reports that his primary obstacle will be "finding a place to go." Patient reports that he has people that he can ask for help but that he doesn't like depending on other people. He reports that he would like to go to a program that will allow him to develop a skill or trade. He states that he is motivated to contact various local emergency housing programs to get more information on eligibility requirement. CSW provided patient with information.  Samuella BruinKristin Kailena Lubas, MSW, [employer]CSWA Clinical Social Worker [facility] [phone]

## 2014-03-17 NOTE — Progress Notes (Signed)
ANTICOAGULATION CONSULT NOTE - Follow Up Consult  Pharmacy Consult for Coumadin Indication: DVT/PE  Allergies  Allergen Reactions  . Naproxen     Made patient feel like he was going to pass out.    Patient Measurements: Height: 6' (182.9 cm) Weight: 153 lb (69.4 kg) IBW/kg (Calculated) : 77.6   Vital Signs: Temp: 98.8 F (37.1 C) (08/10 0629) Temp src: Oral (08/10 0629) BP: 111/87 mmHg (08/10 0630) Pulse Rate: 81 (08/10 0630)  Labs:  Recent Labs  03/16/14 0618 03/17/14 0625  LABPROT 14.5 14.3  INR 1.13 1.11    Estimated Creatinine Clearance: 85.9 ml/min (by C-G formula based on Cr of 1.1).   Medications:  Scheduled:  . citalopram  10 mg Oral Daily  . warfarin  15 mg Oral ONCE-1800  . Warfarin - Pharmacist Dosing Inpatient   Does not apply q1800    Assessment: INR with no increase after 10 mg dose No problems noted  Goal of Therapy:  INR 2-3    Plan:  Coumadin 15 mg x 1 today at 1800 PT/INR in am   Charyl Dancerayne, Harriet Sutphen Marie 03/17/2014,11:02 AM

## 2014-03-17 NOTE — Progress Notes (Signed)
Nutrition Brief Note  Patient identified on the Malnutrition Screening Tool (MST) Report.  Wt Readings from Last 10 Encounters:  03/14/14 153 lb (69.4 kg)  03/05/14 157 lb (71.215 kg)  01/27/14 162 lb (73.483 kg)  01/16/13 164 lb 7 oz (74.588 kg)   Body mass index is 20.75 kg/(m^2). Patient meets criteria for normal weight based on current BMI.   Discussed intake PTA with patient and compared to intake presently.  Discussed changes in intake, if any, and encouraged adequate intake of meals and snacks.  Current diet order is regular and pt is also offered choice of unit snacks mid-morning and mid-afternoon.  Pt is eating as desired.   Labs and medications reviewed.   Admitted with bipolar and major depression. Met with pt who reports 20 pound unintended weight loss in the past 4 months due to poor appetite. Didn't elaborate on intake PTA just said it was poor. Reports eating better since admission, however interested in getting Ensure Complete, will order.   Nutrition Dx:  Unintended wt change r/t suboptimal oral intake AEB pt report  Interventions:   Discussed the importance of nutrition and encouraged intake of food and beverages.     Supplements: Ensure Complete BID  No additional nutrition interventions warranted at this time. If nutrition issues arise, please consult RD.   Carlis Stable MS, [place], LDN [phone] Pager [phone] Weekend/After Hours Pager

## 2014-03-17 NOTE — Progress Notes (Signed)
D:  Patient's self inventory sheet, patient has poor sleep, did not take sleep medication.  Fair appetite, normal energy level, good concentration.  Depression and anxiety #2, hopeless #3.  Denied withdrawals.  Denied SI.  Physical problems, felt lightheaded at times.  Denied physical pain.  No pain medication needed.  Goal is working on serenity today.  Plans to be calm today.  Does have discharge plans.  No problems taking medications after discharge. A:  Medications administered per MD orders.  Emotional support and encouragement given patient. R:  Denied SI and HI.  Denied A/V hallucinations.  Safety maintained with 15 minute checks.

## 2014-03-17 NOTE — [facility] Suicide Risk Assessment (Signed)
[facility] INPATIENT:  Family/Significant Other Suicide Prevention Education  Suicide Prevention Education:  Patient Refusal for Family/Significant Other Suicide Prevention Education: The patient Jim Hawkins has refused to provide written consent for family/significant other to be provided Family/Significant Other Suicide Prevention Education during admission and/or prior to discharge.  Physician notified.  Breeanne Oblinger, [place] CarboKristin L 03/17/2014, 11:48 AM

## 2014-03-17 NOTE — Progress Notes (Signed)
[facility]  03/17/2014 4:34 PM Jim Hawkins AbideKarast Hawkins  MRN:  [id number] Subjective:  States that he used Celexa before. He states he has not been able to get his life back together he has been homeless and he is now dealing with the DVT. Admits that the depression built up. He states he is committed to make things better for himself and his kids. He is looking at going to [facility] Diagnosis:   DSM5: Depressive Disorders:  Major Depressive Disorder - Severe (296.23) Total Time spent with patient: 30 minutes  Axis I: Anxiety Disorder NOS  ADL's:  Intact  Sleep: Fair  Appetite:  Fair  SPsychiatric Specialty Exam: Physical Exam  Review of Systems  Constitutional: Positive for malaise/fatigue.  HENT: Negative.   Eyes: Negative.   Respiratory: Negative.   Cardiovascular: Negative.   Gastrointestinal: Negative.   Genitourinary: Negative.   Musculoskeletal: Negative.   Skin: Negative.   Neurological: Negative.   Endo/Heme/Allergies: Negative.   Psychiatric/Behavioral: Positive for depression. The patient is nervous/anxious.     Blood pressure 111/87, pulse 81, temperature 98.8 F (37.1 C), temperature source Oral, resp. rate 16, height 6' (1.829 m), weight 69.4 kg (153 lb).Body mass index is 20.75 kg/(m^2).  General Appearance: Fairly Groomed  Patent attorneyye Contact::  Fair  Speech:  Clear and Coherent  Volume:  Decreased  Mood:  Anxious and sad, worried  Affect:  Restricted  Thought Process:  Coherent and Goal Directed  Orientation:  Full (Time, Place, and Person)  Thought Content:  events, worries, concerns  Suicidal Thoughts:  No  Homicidal Thoughts:  No  Memory:  Immediate;   Fair Recent;   Fair Remote;   Fair  Judgement:  Fair  Insight:  Present  Psychomotor Activity:  Restlessness  Concentration:  Fair  Recall:  FiservFair  Fund of Knowledge:NA  Language: Fair  Akathisia:  No  Handed:    AIMS (if indicated):     Assets:  Desire for Improvement  Sleep:   Number of Hours: 5.75   Musculoskeletal: Strength & Muscle Tone: within normal limits Gait & Station: normal Patient leans: N/A  Current Medications: Current Facility-Administered Medications  Medication Dose Route Frequency Provider Last Rate Last Dose  . acetaminophen (TYLENOL) tablet 650 mg  650 mg Oral Q6H PRN Kristeen MansFran E Hobson, NP      . alum & mag hydroxide-simeth (MAALOX/MYLANTA) 200-200-20 MG/5ML suspension 30 mL  30 mL Oral Q4H PRN Kristeen MansFran E Hobson, NP      . Melene Muller[START ON 03/18/2014] citalopram (CELEXA) tablet 20 mg  20 mg Oral Daily Rachael FeeIrving A Jamye Balicki, MD      . feeding supplement (ENSURE COMPLETE) (ENSURE COMPLETE) liquid 237 mL  237 mL Oral BID BM [name], RD      . hydrOXYzine (ATARAX/VISTARIL) tablet 25 mg  25 mg Oral TID PRN Shuvon Rankin, NP   25 mg at 03/15/14 1606  . magnesium hydroxide (MILK OF MAGNESIA) suspension 30 mL  30 mL Oral Daily PRN Kristeen MansFran E Hobson, NP      . methocarbamol (ROBAXIN) tablet 500 mg  500 mg Oral BID PRN Shuvon Rankin, NP      . traZODone (DESYREL) tablet 50 mg  50 mg Oral QHS PRN Kristeen MansFran E Hobson, NP      . warfarin (COUMADIN) tablet 15 mg  15 mg Oral ONCE-1800 Nehemiah MassedFernando Cobos, MD      . Warfarin - Pharmacist Dosing Inpatient   Does not apply [id number] Nehemiah MassedFernando Cobos, MD  Lab Results:  Results for orders placed during the Hawkins encounter of 03/14/14 (from the past 48 hour(s))  PROTIME-INR     Status: None   Collection Time    03/16/14  6:18 AM      Result Value Ref Range   Prothrombin Time 14.5  11.6 - 15.2 seconds   INR 1.13  0.00 - 1.49   Comment: Performed at [facility]  PROTIME-INR     Status: None   Collection Time    03/17/14  6:25 AM      Result Value Ref Range   Prothrombin Time 14.3  11.6 - 15.2 seconds   INR 1.11  0.00 - 1.49   Comment: Performed at [facility]    Physical Findings: AIMS: Facial and Oral Movements Muscles of Facial Expression: None, normal Lips and Perioral Area: None,  normal Jaw: None, normal Tongue: None, normal,Extremity Movements Upper (arms, wrists, hands, fingers): None, normal Lower (legs, knees, ankles, toes): None, normal, Trunk Movements Neck, shoulders, hips: None, normal, Overall Severity Severity of abnormal movements (highest score from questions above): None, normal Incapacitation due to abnormal movements: None, normal Patient's awareness of abnormal movements (rate only patient's report): No Awareness, Dental Status Current problems with teeth and/or dentures?: No Does patient usually wear dentures?: No  CIWA:    COWS:     Treatment Plan Summary: Daily contact with patient to assess and evaluate symptoms and progress in treatment Medication management  Plan: Supportive approach/coping skills           CBT           Will increase the Celexa to 20 mg daily  Medical Decision Making Problem Points:  Review of psycho-social stressors (1) Data Points:  Review of new medications or change in dosage (2)  I certify that inpatient services furnished can reasonably be expected to improve the patient's condition.   Carsen Machi A 03/17/2014, 4:34 PM

## 2014-03-17 NOTE — [facility] Group Notes (Signed)
[facility]   03/17/2014 10:34 AM    Participation Quality:  Appropraite  Mood/Affect:  Appropriate  Depression Rating:  3  Anxiety Rating:  3  Thoughts of Suicide:  No  Will you contract for safety?   NA  Current AVH:  No  Plan for Discharge/Comments:  Patient attended discharge planning group and actively participated in group.  He advised of being interested in going [facility] at discharge.  CSW provided all participants with daily workbook.   Transportation Means: Patient has access to public transportation.   Supports:  Patient has a support system.   Chellsie Gomer, Joesph JulyQuylle Hairston

## 2014-03-17 NOTE — Progress Notes (Signed)
Pt attended spiritual care group on grief and loss facilitated by chaplain Burnis KingfisherMatthew Nabila Albarracin. Group opened with brief discussion and psycho-social ed around grief and loss in relationships and in relation to self, identifying life patterns, circumstances, changes that cause losses. Established group norm of speaking from own life experience. Group goal of establishing open and affirming space for members to share loss and experience with grief, normalize grief experience and provide psycho social education and grief support.   Jim Hawkins was present throughout group.  He showed appreciation and resonance for other group member's sharing their stories.  He did not share from his own experience.    Jim Hawkins, Jim Hawkins Wayne MDiv

## 2014-03-18 ENCOUNTER — Encounter (HOSPITAL_COMMUNITY): Payer: Self-pay | Admitting: Emergency Medicine

## 2014-03-18 DIAGNOSIS — F322 Major depressive disorder, single episode, severe without psychotic features: Secondary | ICD-10-CM

## 2014-03-18 LAB — PROTIME-INR
INR: 1.23 (ref 0.00–1.49)
Prothrombin Time: 15.5 seconds — ABNORMAL HIGH (ref 11.6–15.2)

## 2014-03-18 MED ORDER — ENOXAPARIN SODIUM 80 MG/0.8ML ~~LOC~~ SOLN
1.0000 mg/kg | Freq: Once | SUBCUTANEOUS | Status: AC
  Administered 2014-03-18: 70 mg via SUBCUTANEOUS
  Filled 2014-03-18: qty 0.8

## 2014-03-18 MED ORDER — WARFARIN SODIUM 7.5 MG PO TABS
15.0000 mg | ORAL_TABLET | Freq: Once | ORAL | Status: AC
  Administered 2014-03-18: 15 mg via ORAL
  Filled 2014-03-18: qty 2

## 2014-03-18 NOTE — Progress Notes (Signed)
Pt observed in his room at this time.  He did not attend evening group tonight.  He reports he has had a good day.  He intends to continue taking his medication because he knows that is good for him.  He hopes to discharge soon and feels he is ready.  Pt was pleasant/polite during conversation with Clinical research associatewriter.  He was encouraged to make his needs known to staff.  Pt voiced no needs or concerns at this time.  Support and encouragement offered.  Safety maintained with q15 minute checks.

## 2014-03-18 NOTE — ED Notes (Signed)
[facility] called to transport pt back to [facility]. 

## 2014-03-18 NOTE — Progress Notes (Signed)
ANTICOAGULATION CONSULT NOTE - Follow Up Consult  Pharmacy Consult for Coumadin Indication:ho dvt/pe  Allergies  Allergen Reactions  . Naproxen     Made patient feel like he was going to pass out.    Patient Measurements: Height: 6' (182.9 cm) Weight: 153 lb (69.4 kg) IBW/kg (Calculated) : 77.6   Vital Signs: Temp: 97.9 F (36.6 C) (08/11 0751) Temp src: Oral (08/11 0751) BP: 118/80 mmHg (08/11 0752) Pulse Rate: 82 (08/11 0752)  Labs:  Recent Labs  03/16/14 0618 03/17/14 0625 03/18/14 0624  LABPROT 14.5 14.3 15.5*  INR 1.13 1.11 1.23    Estimated Creatinine Clearance: 85.9 ml/min (by C-G formula based on Cr of 1.1).   Medications:  Scheduled:  . citalopram  20 mg Oral Daily  . feeding supplement (ENSURE COMPLETE)  237 mL Oral BID BM  . warfarin  15 mg Oral ONCE-1800  . Warfarin - Pharmacist Dosing Inpatient   Does not apply q1800    Assessment: INR increased slightly after 15 mg dose.  No problems noted INR below goal Goal of Therapy:  INR 2-3    Plan:  Coumadin 15 mg x 1 today  PT/INR in am   Charyl Dancerayne, Johanna Stafford Marie 03/18/2014,8:13 AM

## 2014-03-18 NOTE — ED Provider Notes (Signed)
CSN: [id number]     Arrival date & time 03/18/14  1823 History   First MD Initiated Contact with Patient 03/18/14 1851     Chief Complaint  Patient presents with  . Leg Pain  . Medical Clearance     (Consider location/radiation/quality/duration/timing/severity/associated sxs/prior Treatment) Patient is a 43 y.o. male presenting with leg pain. The history is provided by the patient and medical records.  Leg Pain  This is a 43 year old male with past history significant for depression, bipolar disorder, DVT and PE, presenting to the ED for left lower thigh pain.  Patient denies recent injury, trauma, or falls.  Patient states pain began earlier today, worse with walking.  States similar sx when he had has prior DVT.  Denies chest pain or SOB.  No fever, chills, sweats.  Patient currently on coumadin, has been having trouble keeping INR in range.  Patient [facility], states this is going well for him.  He has no additional psychiatric or physical complaints at this time.  Past Medical History  Diagnosis Date  . DVT (deep venous thrombosis)   . Pulmonary embolism   . Homelessness   . Depression   . Bipolar 1 disorder    Past Surgical History  Procedure Laterality Date  . Green filter     History reviewed. No pertinent family history. History  Substance Use Topics  . Smoking status: Light Tobacco Smoker -- 0.25 packs/day    Types: Cigarettes  . Smokeless tobacco: Not on file  . Alcohol Use: No    Review of Systems  Musculoskeletal: Positive for arthralgias and myalgias.  All other systems reviewed and are negative.     Allergies  Naproxen  Home Medications   Prior to Admission medications   Not on File   BP 126/73  Pulse 58  Temp(Src) 99 F (37.2 C) (Oral)  Resp 15  Ht 6' (1.829 m)  Wt 153 lb (69.4 kg)  BMI 20.75 kg/m2  SpO2 99%  Physical Exam  Nursing note and vitals reviewed. Constitutional: He is oriented to person,  place, and time. He appears well-developed and well-nourished. No distress.  HENT:  Head: Normocephalic and atraumatic.  Mouth/Throat: Oropharynx is clear and moist.  Eyes: Conjunctivae and EOM are normal. Pupils are equal, round, and reactive to light.  Neck: Normal range of motion. Neck supple.  Cardiovascular: Normal rate, regular rhythm and normal heart sounds.   Pulmonary/Chest: Effort normal and breath sounds normal. No respiratory distress. He has no wheezes.  Abdominal: Soft. Bowel sounds are normal. There is no tenderness. There is no guarding.  Musculoskeletal: Normal range of motion.  Left thigh without asymmetry when compared with right; no overlying erythema, warmth to touch, or palpable cords; full ROM of knee and ankle without pain; DP pulse intact; normal strength throughout  Neurological: He is alert and oriented to person, place, and time.  Skin: Skin is warm and dry. He is not diaphoretic.  Psychiatric: He has a normal mood and affect.    ED Course  Procedures (including critical care time) Labs Review Labs Reviewed  PROTIME-INR - Abnormal; Notable for the following:    Prothrombin Time 15.5 (*)    All other components within normal limits  PROTIME-INR  PROTIME-INR  PROTIME-INR    Imaging Review No results found.   EKG Interpretation None      MDM   Diagnosis: 1.  Left leg pain  43 y.o. M with hx of DVT and PE, coumdin  currently subtherapeutic.  He complains of left lower medial thigh pain.  On exam, no appreciable erythema, swelling, or palpable cords and DP pulse intact.  No chest pain or SOB to suggest PE, VS stable and respirations are unlabored.  Vascular u/s could not be completed today.  Patient given ppx dose of lovenox given his sub-therapeutic INR, will return to radiology dept tomorrow morning for LE duplex.  Discussed plan with patient, he/she acknowledged understanding and agreed with plan of care.  Return precautions given for new or worsening  symptoms.  [facility].  Case discussed with attending physician, Dr. Littie Deeds, who agrees with assessment and plan of care.  Garlon Hatchet, PA-C 03/19/14 1109

## 2014-03-18 NOTE — Progress Notes (Signed)
[facility]  03/18/2014 4:53 PM Jim Hawkins Atlee AbideKarast Alipio  MRN:  [id number] Subjective:  Jim Hawkins states he was feeling dizzy, not clear headed, for what he asked not to take the Celexa 20 mg. States he was wanting to get used to the smaller dose first before going up to the 20 mg. States that he is still unsure of where to go from here. Trying to decide between [facility]. [place]. States he would not go back there as there is more racism less diversity that in [place]. Admits he is very hyperaware of the prejudice that exist and he is usually apprehensive and in defensive mode easily reactive to what is going on outside for what he would rather avoid conflict. He states he is always worried about his DVT and the possibility of recurrence Diagnosis:   DSM5: Depressive Disorders:  Major Depressive Disorder - Severe (296.23) Total Time spent with patient: 30 minutes  Axis I: Generalized Anxiety Disorder  ADL's:  Intact  Sleep: Fair  Appetite:  Fair  SPsychiatric Specialty Exam: Physical Exam  Review of Systems  Constitutional: Positive for malaise/fatigue.  HENT: Negative.   Eyes: Negative.   Respiratory: Negative.   Cardiovascular: Negative.   Gastrointestinal: Negative.   Genitourinary: Negative.   Musculoskeletal: Negative.   Skin: Negative.   Neurological: Positive for dizziness.  Endo/Heme/Allergies: Negative.   Psychiatric/Behavioral: Positive for depression. The patient is nervous/anxious.     Blood pressure 118/80, pulse 82, temperature 97.9 F (36.6 C), temperature source Oral, resp. rate 18, height 6' (1.829 m), weight 69.4 kg (153 lb).Body mass index is 20.75 kg/(m^2).  General Appearance: Fairly Groomed  Patent attorneyye Contact::  Fair  Speech:  Clear and Coherent  Volume:  fluctuates  Mood:  Anxious and sad, worried  Affect:  Restricted and anxious, worried  Thought Process:  Coherent and Goal Directed   Orientation:  Full (Time, Place, and Person)  Thought Content:  events in his life his response, his symtpoms, worries and concerns  Suicidal Thoughts:  No  Homicidal Thoughts:  No  Memory:  Immediate;   Fair Recent;   Fair Remote;   Fair  Judgement:  Fair  Insight:  Present  Psychomotor Activity:  Restlessness  Concentration:  Fair  Recall:  FiservFair  Fund of Knowledge:NA  Language: Fair  Akathisia:  No  Handed:    AIMS (if indicated):     Assets:  Desire for Improvement  Sleep:  Number of Hours: 5.75   Musculoskeletal: Strength & Muscle Tone: within normal limits Gait & Station: normal Patient leans: N/A  Current Medications: Current Facility-Administered Medications  Medication Dose Route Frequency Provider Last Rate Last Dose  . acetaminophen (TYLENOL) tablet 650 mg  650 mg Oral Q6H PRN Kristeen MansFran E Hobson, NP      . alum & mag hydroxide-simeth (MAALOX/MYLANTA) 200-200-20 MG/5ML suspension 30 mL  30 mL Oral Q4H PRN Kristeen MansFran E Hobson, NP      . citalopram (CELEXA) tablet 20 mg  20 mg Oral Daily Rachael FeeIrving A Raheim Beutler, MD   20 mg at 03/18/14 0756  . feeding supplement (ENSURE COMPLETE) (ENSURE COMPLETE) liquid 237 mL  237 mL Oral BID BM [name], RD   237 mL at 03/18/14 1513  . hydrOXYzine (ATARAX/VISTARIL) tablet 25 mg  25 mg Oral TID PRN Shuvon Rankin, NP   25 mg at 03/15/14 1606  . magnesium hydroxide (MILK OF MAGNESIA) suspension 30 mL  30  mL Oral Daily PRN Kristeen Mans, NP      . methocarbamol (ROBAXIN) tablet 500 mg  500 mg Oral BID PRN Shuvon Rankin, NP      . traZODone (DESYREL) tablet 50 mg  50 mg Oral QHS PRN Kristeen Mans, NP      . warfarin (COUMADIN) tablet 15 mg  15 mg Oral ONCE-1800 [name], MD      . Warfarin - Pharmacist Dosing Inpatient   Does not apply [id number] [name], MD   15 each at 03/17/14 1731    Lab Results:  Results for orders placed during the hospital encounter of 03/14/14 (from the past 48 hour(s))  PROTIME-INR     Status: None   Collection  Time    03/17/14  6:25 AM      Result Value Ref Range   Prothrombin Time 14.3  11.6 - 15.2 seconds   INR 1.11  0.00 - 1.49   Comment: Performed at [facility]  PROTIME-INR     Status: Abnormal   Collection Time    03/18/14  6:24 AM      Result Value Ref Range   Prothrombin Time 15.5 (*) 11.6 - 15.2 seconds   INR 1.23  0.00 - 1.49   Comment: Performed at [facility]    Physical Findings: AIMS: Facial and Oral Movements Muscles of Facial Expression: None, normal Lips and Perioral Area: None, normal Jaw: None, normal Tongue: None, normal,Extremity Movements Upper (arms, wrists, hands, fingers): None, normal Lower (legs, knees, ankles, toes): None, normal, Trunk Movements Neck, shoulders, hips: None, normal, Overall Severity Severity of abnormal movements (highest score from questions above): None, normal Incapacitation due to abnormal movements: None, normal Patient's awareness of abnormal movements (rate only patient's report): No Awareness, Dental Status Current problems with teeth and/or dentures?: No Does patient usually wear dentures?: No  CIWA:  CIWA-Ar Total: 1 COWS:  COWS Total Score: 2  Treatment Plan Summary: Daily contact with patient to assess and evaluate symptoms and progress in treatment Medication management  Plan: Supportive approach/coping skills/CBT, mindfulness           Decrease the Celexa to 10 mg daily           Explore placement options  Medical Decision Making Problem Points:  Review of psycho-social stressors (1) Data Points:  Review of new medications or change in dosage (2)  I certify that inpatient services furnished can reasonably be expected to improve the patient's condition.   Nikolette Reindl A 03/18/2014, 4:53 PM

## 2014-03-18 NOTE — Tx Team (Signed)
Interdisciplinary Treatment Plan Update (Adult) Date: 03/18/2014   Time Reviewed: 9:30 AM  Progress in Treatment: Attending groups: Yes Participating in groups: Yes Taking medication as prescribed: No Tolerating medication: Yes Family/Significant other contact made: Unknown Patient understands diagnosis: Yes Discussing patient identified problems/goals with staff: Yes Medical problems stabilized or resolved: Yes Denies suicidal/homicidal ideation: Yes Issues/concerns per patient self-inventory: Yes Other:  New problem(s) identified: N/A  Discharge Plan or Barriers: CSW continuing to assess. Patient is homeless and [facility]. CSW provided patient with information. CSW will arrange follow-up when patient decides what city he will be going to at discharge.   Reason for Continuation of Hospitalization:  Depression Anxiety Medication Stabilization   Comments: N/A  Estimated length of stay: Discharge likely tomorrow 03/19/14  For review of initial/current patient goals, please see plan of care.  Attendees: Patient:    Family:    Physician: Dr. Dub MikesLugo  03/18/2014 9:30 AM  Nursing: Neill Loftarol Davis, RN 03/18/2014 9:30 AM  Clinical Social Worker: Samuella BruinKristin Avalynn Bowe,  LCSWA 03/18/2014 9:30 AM  Other: Juline PatchQuylle Hodnett, LCSW 03/18/2014 9:30 AM  Other: Leisa LenzValerie Enoch, Vesta [facility] Liaison 03/18/2014 9:30 AM  Other: Onnie BoerJennifer Clark, Case Manager 03/18/2014 9:30 AM  Other:  Lysbeth PennerMary Smith, RN 03/18/2014 9:30 AM  Other:    Other:    Other:    Other:    Other:      Scribe for Treatment Team:  Samuella BruinKristin Diara Chaudhari, MSW, LCSWA [phone]

## 2014-03-18 NOTE — Progress Notes (Signed)
Recreation Therapy Notes  Animal-Assisted Activity/Therapy (AAA/T) Program Checklist/Progress Notes Patient Eligibility Criteria Checklist & Daily Group note for Rec Tx Intervention  Date: 08.11.2015 Time: 2:45pm Location: [address] Dayroom   AAA/T Program Assumption of Risk Form signed by Patient/ or Parent Legal Guardian yes  Patient is free of allergies or sever asthma yes  Patient reports no fear of animals yes  Patient reports no history of cruelty to animals yes   Patient understands his/her participation is voluntary yes  Patient washes hands before animal contact yes  Patient washes hands after animal contact yes  Behavioral Response: Engaged, Appropriate   Education: Hand Washing, Appropriate Animal Interaction   Education Outcome: Acknowledges understanding  Clinical Observations/Feedback: Patient interacted appropriately with therapeutic dog team and peers in session.   Jim Hawkins, LRT/CTRS  Jim Hawkins 03/18/2014 5:03 PM 

## 2014-03-18 NOTE — Progress Notes (Signed)
Patient given celexa 20 mg for morning dose.  Patient bit pill in half, swallowed 10 mg.  Stated he did not want to take celexa 20 mg, stated he has felt lightheaded, etc and his body is not use to 10 mg yet.  Will discuss with MD.  Nurse put 10 mg half in med drawer.

## 2014-03-18 NOTE — Progress Notes (Signed)
D:  Patient's self inventory sheet, patient has poor sleep, no sleep medication.  Fair appetite, low energy level, good concentration.  Rated depression 2 and anxiety 2, hopeless 1.  Denied withdrawals.  Denied SI.   Has panic attacks.  Concerned about his health.  Needs rest.   A:  Emotional support and encouragement given patient. R:  Denied SI and HI.  Denied A/V hallucinations.  Safety maintained with 15 minute checks.

## 2014-03-18 NOTE — ED Notes (Signed)
Bed: [id number] Expected date:  Expected time:  Means of arrival:  Comments: [facility] transfer-DVT

## 2014-03-18 NOTE — Progress Notes (Signed)
The focus of this group is to educate the patient on the purpose and policies of crisis stabilization and provide a format to answer questions about their admission.  The group details unit policies and expectations of patients while admitted.  Patient attended 0900 nurse education orientation group this morning.  Patient actively participated, appropriate affect, alert, appropriate insight and engagement.  Today patient will work on 3 goals for discharge.  

## 2014-03-18 NOTE — ED Notes (Addendum)
Per EMS: [facility]. Pt is reporting pain to the upper, inner, left thigh that began earlier today. Pt reports having a DVT and PE and reports that this feels similar.  [facility] depression, suicidal ideation, bipolar disorder, and anger. Pt is ambulatory and in NAD. EMS reports pt being admitted voluntarily.

## 2014-03-18 NOTE — Progress Notes (Addendum)
[facility].  Pt feels his blood clot has moved up his leg.  Dr. Dub MikesLugo [facility].  Patient left [facility] at 1810 via EMS [facility].

## 2014-03-18 NOTE — Discharge Instructions (Signed)
Return tomorrow anytime after 8am [facility] to have lower extremity duplex completed. Return to the ED for new or worsening symptoms.

## 2014-03-19 ENCOUNTER — Ambulatory Visit (HOSPITAL_COMMUNITY): Payer: Self-pay | Attending: Pediatrics

## 2014-03-19 DIAGNOSIS — F329 Major depressive disorder, single episode, unspecified: Principal | ICD-10-CM

## 2014-03-19 MED ORDER — WARFARIN SODIUM 10 MG PO TABS: 10.0000 mg | ORAL_TABLET | Freq: Every day | ORAL | Status: AC

## 2014-03-19 MED ORDER — WARFARIN SODIUM 10 MG PO TABS
10.0000 mg | ORAL_TABLET | Freq: Every day | ORAL | Status: DC
  Filled 2014-03-19: qty 4

## 2014-03-19 MED ORDER — TRAZODONE HCL 50 MG PO TABS: 50.0000 mg | ORAL_TABLET | Freq: Every evening | ORAL | Status: AC | PRN

## 2014-03-19 MED ORDER — CITALOPRAM HYDROBROMIDE 20 MG PO TABS: 20.0000 mg | ORAL_TABLET | Freq: Every day | ORAL | Status: AC

## 2014-03-19 MED ORDER — WARFARIN SODIUM 10 MG PO TABS
10.0000 mg | ORAL_TABLET | Freq: Every day | ORAL | Status: DC
Start: 2014-03-19 — End: 2014-03-19

## 2014-03-19 MED ORDER — HYDROXYZINE HCL 25 MG PO TABS: 25.0000 mg | ORAL_TABLET | Freq: Three times a day (TID) | ORAL | Status: AC | PRN

## 2014-03-19 MED ORDER — WARFARIN SODIUM 10 MG PO TABS
10.0000 mg | ORAL_TABLET | Freq: Once | ORAL | Status: AC
  Administered 2014-03-19: 10 mg via ORAL
  Filled 2014-03-19: qty 1

## 2014-03-19 MED ORDER — WARFARIN SODIUM 10 MG PO TABS
10.0000 mg | ORAL_TABLET | Freq: Once | ORAL | Status: DC
  Filled 2014-03-19: qty 1

## 2014-03-19 MED ORDER — WARFARIN SODIUM 5 MG PO TABS: 5.0000 mg | ORAL_TABLET | Freq: Every day | ORAL | Status: DC

## 2014-03-19 NOTE — Discharge Summary (Signed)
Physician Discharge Summary Note  Patient:  Jim Hawkins is an 43 y.o., male MRN:  [id number] DOB:  [birth year] Patient phone:  [phone] (home)  Patient address:   [place] [address],  Total Time spent with patient: Greater than 30 minutes  Date of Admission:  03/14/2014 Date of Discharge: 03/19/14  Reason for Admission: Mood stabilization  Discharge Diagnoses: Active Problems:   MDD (major depressive disorder), single episode   Psychiatric Specialty Exam: Physical Exam  Psychiatric: His speech is normal and behavior is normal. Judgment and thought content normal. His mood appears not anxious. His affect is not angry, not blunt, not labile and not inappropriate. Cognition and memory are normal. He does not exhibit a depressed mood.    Review of Systems  Constitutional: Negative.   HENT: Negative.   Eyes: Negative.   Respiratory: Negative.   Cardiovascular: Negative.   Gastrointestinal: Negative.   Genitourinary: Negative.   Skin: Negative.   Neurological: Negative.   Endo/Heme/Allergies: Negative.   Psychiatric/Behavioral: Positive for depression (Stable). Negative for suicidal ideas, hallucinations, memory loss and substance abuse. The patient has insomnia (Stable). The patient is not nervous/anxious.     Blood pressure 133/88, pulse 91, temperature 98.1 F (36.7 C), temperature source Oral, resp. rate 16, height 6' (1.829 m), weight 69.4 kg (153 lb), SpO2 100.00%.Body mass index is 20.75 kg/(m^2).   General Appearance: Fairly Groomed   Patent attorney:: Fair   Speech: Clear and Coherent   Volume: Normal   Mood: Anxious and worried   Affect: worry about his leg   Thought Process: Coherent and Goal Directed   Orientation: Full (Time, Place, and Person)   Thought Content: plans as he moves on, wants to go to [facility] to be checked for new clots   Suicidal Thoughts: No   Homicidal Thoughts: No   Memory: Immediate; Fair  Recent; Fair  Remote; Fair    Judgement: Fair   Insight: Present   Psychomotor Activity: Normal   Concentration: Fair   Recall: [employer] of Knowledge:NA   Language: Fair   Akathisia: No   Handed: Ambidextrous   AIMS (if indicated):   Assets: Desire for Improvement   Sleep: Number of Hours: 6.5      Past Psychiatric History: Diagnosis: MDD (major depressive disorder), single episode  Hospitalizations: [facility] adult unit  Out[facility]: [facility]  Substance Abuse Care: NA  Self-Mutilation: NA  Suicidal Attempts: NA, Hx of SI  Violent Behaviors: NA   Musculoskeletal: Strength & Muscle Tone: within normal limits Gait & Station: normal Patient leans: N/A  DSM5: Schizophrenia Disorders:  NA Obsessive-Compulsive Disorders:  NA Trauma-Stressor Disorders:  NA Substance/Addictive Disorders:  NA Depressive Disorders:  MDD (major depressive disorder), single episode  Axis Diagnosis:  AXIS I:  MDD (major depressive disorder), single episode AXIS II:  Deferred AXIS III:   Past Medical History  Diagnosis Date  . DVT (deep venous thrombosis)   . Pulmonary embolism   . Homelessness   . Depression   . Bipolar 1 disorder    AXIS IV:  other psychosocial or environmental problems and mental illness, chronic AXIS V:  62  Level of Care:  OP  Hospital Course:  "I really just wanted to do my self in. I said just let me just check my self in cause I really just don't want to kill my self. But when I get into that really dark place the option I feel I have  is to kill myself. Patient states that his trigger was not being able to do anything to help himself. I am just tired of watching my people be treated wrong. I am tired of it. I am no dummy I speak several different Languages , I am no dummy.   While a patient in this hospital, Jim Hawkins received mood stabilization treatments. This is to re-stabilize his depressed/hopeless mood. This was achieved using medication management and group  counseling sessions. He was medicated and discharged on Citalopram 20 mg daily for depression, Hydroxyzine 25 mg three times daily as needed for anxiety/tension and Trazodone 50 mg Q bedtime for sleep. He was resumed on his anticoagulant therapy (Warfarin) for the hx of deep vein thrombosis. Jim Hawkins is strictly instructed to follow-up with his primary care provider after discharge for coumadin dose adjustments and required blood work. His next PT/INR is due to be drawn on the 03/21/14. He is cautiously instructed and encouraged to make this appointment on Friday. He also enrolled and participated in the group counseling sessions being offered and held on this unit. He learned coping skills.   Jim Hawkins's symptoms responded well to his ordered treatment regimen. This is evidenced by his reports of improved mood and absence of suicidal ideations. He is currently being discharged to continue psychiatric treatment at the [facility] in [place], [place]. He is provided with all the necessary information required to make this appointment without problems. Upon discharge, he adamantly denies any SIHI, AVH, delusional thoughts, paranoia and or withdrawal symptoms. He received a 14 days worth, supply samples of his [facility] discharge medications. He left [facility] with all personal belongings in no apparent distress. Transportation per city bus. [facility] provided bus pass.  Consults:  psychiatry  Significant Diagnostic Studies:  labs: CBC with diff, CMP, UDS, toxicology tests, U/A  Discharge Vitals:   Blood pressure 133/88, pulse 91, temperature 98.1 F (36.7 C), temperature source Oral, resp. rate 16, height 6' (1.829 m), weight 69.4 kg (153 lb), SpO2 100.00%. Body mass index is 20.75 kg/(m^2). Lab Results:   Results for orders placed during the hospital encounter of 03/14/14 (from the past 72 hour(s))  PROTIME-INR     Status: None   Collection Time    03/17/14  6:25 AM      Result Value Ref Range    Prothrombin Time 14.3  11.6 - 15.2 seconds   INR 1.11  0.00 - 1.49   Comment: Performed at [facility]  PROTIME-INR     Status: Abnormal   Collection Time    03/18/14  6:24 AM      Result Value Ref Range   Prothrombin Time 15.5 (*) 11.6 - 15.2 seconds   INR 1.23  0.00 - 1.49   Comment: Performed at [facility]    Physical Findings: AIMS: Facial and Oral Movements Muscles of Facial Expression: None, normal Lips and Perioral Area: None, normal Jaw: None, normal Tongue: None, normal,Extremity Movements Upper (arms, wrists, hands, fingers): None, normal Lower (legs, knees, ankles, toes): None, normal, Trunk Movements Neck, shoulders, hips: None, normal, Overall Severity Severity of abnormal movements (highest score from questions above): None, normal Incapacitation due to abnormal movements: None, normal Patient's awareness of abnormal movements (rate only patient's report): No Awareness, Dental Status Current problems with teeth and/or dentures?: No Does patient usually wear dentures?: No  CIWA:  CIWA-Ar Total: 2 COWS:  COWS Total Score: 3  Psychiatric Specialty Exam: See Psychiatric Specialty Exam and  Suicide Risk Assessment completed by Attending Physician prior to discharge.  Discharge destination:  Home  Is patient on multiple antipsychotic therapies at discharge:  No   Has Patient had three or more failed trials of antipsychotic monotherapy by history:  No  Recommended Plan for Multiple Antipsychotic Therapies: NA    Medication List       Indication   citalopram 20 MG tablet  Commonly known as:  CELEXA  Take 1 tablet (20 mg total) by mouth daily. For depression   Indication:  Aggressive Behavior, Depression, Panic Disorder     hydrOXYzine 25 MG tablet  Commonly known as:  ATARAX/VISTARIL  Take 1 tablet (25 mg total) by mouth 3 (three) times daily as needed for anxiety.   Indication:  Anxiety     traZODone 50 MG tablet   Commonly known as:  DESYREL  Take 1 tablet (50 mg total) by mouth at bedtime as needed for sleep.   Indication:  Trouble Sleeping     warfarin 10 MG tablet  Commonly known as:  COUMADIN  Take 1 tablet (10 mg total) by mouth daily before supper. (Have your blood drawn on Friday 03-20-14 to check your coumadin level): Blood thinner   Indication:  Blood thinner       Follow-up Information   [facility]. (Please present to [facility] [place] office Monday-Friday from 8-3 pm for walk-in assessment for therapy and medication assistance. )    Contact information:   Phone [phone] Fax [phone]  [facility] [address] [place], [place] [id number]       Follow-up recommendations: Activity:  As tolerated Diet: As recommended by your primary care doctor. Keep all scheduled follow-up appointments as recommended.   Comments: Take all your medications as prescribed by your mental healthcare provider. Report any adverse effects and or reactions from your medicines to your outpatient provider promptly. Patient is instructed and cautioned to not engage in alcohol and or illegal drug use while on prescription medicines. In the event of worsening symptoms, patient is instructed to call the crisis hotline, 911 and or go to the nearest ED for appropriate evaluation and treatment of symptoms. Follow-up with your primary care provider for your other medical issues, concerns and or health care needs.    Total Discharge Time:  Greater than 30 minutes.  Signed: Sanjuana KavaNwoko, Agnes I, PMHNP, FNP 03/19/2014, 10:26 AM I personally assessed the patient and formulated the plan Madie RenoIrving A. Dub MikesLugo, M.D.

## 2014-03-19 NOTE — Progress Notes (Signed)
ANTICOAGULATION CONSULT NOTE - Follow Up Consult  Pharmacy Consult for Coumadin Indication:dvt/pe  Allergies  Allergen Reactions  . Naproxen     Made patient feel like he was going to pass out.    Patient Measurements: Height: 6' (182.9 cm) Weight: 153 lb (69.4 kg) IBW/kg (Calculated) : 77.6   Vital Signs: Temp: 98.1 F (36.7 C) (08/12 0620) BP: 133/88 mmHg (08/12 0621) Pulse Rate: 91 (08/12 0621)  Labs:  Recent Labs  03/17/14 0625 03/18/14 0624  LABPROT 14.3 15.5*  INR 1.11 1.23    Estimated Creatinine Clearance: 85.9 ml/min (by C-G formula based on Cr of 1.1).   Medications:  Scheduled:  . citalopram  20 mg Oral Daily  . feeding supplement (ENSURE COMPLETE)  237 mL Oral BID BM  . Warfarin - Pharmacist Dosing Inpatient   Does not apply q1800    Assessment: INR below goal.  Patient being discharged today.   Goal of Therapy:  INR 2-3    Plan:  Coumadin 10 mg before discharge today.  Will provide Coumadin 10 mg po daily #4 on discharge.  Patient has been told that INR needs to be monitored and drawn on Friday 8/14 for appropriate dose as it is not therapeutic at this time.  Peggye FothergillPayne, Harless Molinari Marie 03/19/2014,10:35 AM

## 2014-03-19 NOTE — [facility] Group Notes (Addendum)
LATE NOTE FROM 03/18/14 1:15 PM:  [facility] 03/18/14 1:15 PM  Type of Therapy: Group Therapy  Participation Level: Active  Participation Quality: Attentive, Sharing and Supportive  Affect: Depressed and Flat  Cognitive: Alert and Oriented  Insight: Developing/Improving and Engaged  Engagement in Therapy: Developing/Improving and Engaged Modes of Intervention: Clarification, Confrontation, Discussion, Education, Exploration, Limit-setting, Orientation, Problem-solving, Rapport Building, Dance movement psychotherapisteality Testing, Socialization and Support  Summary of Progress/Problems: Topic for today's discussion is centered around feelings about diagnosis. Patient shared that he tends to isolate himself from stressful situation and focuses on his spirituality which has been helpful to him in the past.  Samuella BruinKristin Pepper Wyndham, MSW, [employer]LCSWA Clinical Social Worker [facility] [phone]

## 2014-03-19 NOTE — Progress Notes (Signed)
[facility] Discharge Plan :  Will you be returning to the same living situation after discharge: No. Patient reports his intention to [facility] [place]. At discharge, do you have transportation home?:Yes,  patient requesting assistance with bus pass Do you have the ability to pay for your medications:Yes,  patient will be provided with necessary medication samples and prescriptions at discharge.  Release of information consent forms completed and in the chart;  Patient's signature needed at discharge.  Patient to Follow up at: Follow-up Information   [facility] (Please present to [facility] [place] office Monday-Friday from 8am-12pm or 1-3pm for walk-in assessment for therapy and medication assistance. )    Contact information:   Phone [phone] Fax [phone]  [facility] [address] [place], [place] [address]        Patient denies SI/HI:   Yes,  denies    Safety Planning and Suicide Prevention discussed:  Yes,  with patient  Jim Hawkins, West CarboKristin L 03/19/2014, 10:43 AM

## 2014-03-19 NOTE — Progress Notes (Signed)
Pt was discharged home today.  He denied any S/I H/I or A/V hallucinations.  He was given f/u appointment, rx, sample medications, hotline info booklet, bus pass, and PART money provided by the case manager.  He was given a one-time dose of coumadin 10 mg and instructed to go to have his blood drawn for PT/INR on Friday, August 14th In addition, He was instructed to go for his doppler study once he leaves here. He voiced understanding to all instructions provided.  He declined the need for smoking cessation materials.

## 2014-03-19 NOTE — [facility] Suicide Risk Assessment (Signed)
Suicide Risk Assessment  Discharge Assessment     Demographic Factors:  Male  Total Time spent with patient: 30 minutes  Psychiatric Specialty Exam:     Blood pressure 133/88, pulse 91, temperature 98.1 F (36.7 C), temperature source Oral, resp. rate 16, height 6' (1.829 m), weight 69.4 kg (153 lb), SpO2 100.00%.Body mass index is 20.75 kg/(m^2).  General Appearance: Fairly Groomed  Patent attorneyye Contact::  Fair  Speech:  Clear and Coherent  Volume:  Normal  Mood:  Anxious and worried  Affect:  worry about his leg  Thought Process:  Coherent and Goal Directed  Orientation:  Full (Time, Place, and Person)  Thought Content:  plans as he moves on, [facility]  Suicidal Thoughts:  No  Homicidal Thoughts:  No  Memory:  Immediate;   Fair Recent;   Fair Remote;   Fair  Judgement:  Fair  Insight:  Present  Psychomotor Activity:  Normal  Concentration:  Fair  Recall:  [employer]  Fund of Knowledge:NA  Language: Fair  Akathisia:  No  Handed:  Ambidextrous  AIMS (if indicated):     Assets:  Desire for Improvement  Sleep:  Number of Hours: 6.5    Musculoskeletal: Strength & Muscle Tone: within normal limits Gait & Station: normal Patient leans: N/A   Mental Status Per Nursing Assessment::   On Admission:     Current Mental Status by Physician: In full contact with reality. There are no active SI, HI plans or intent. Wants to be D/C today. [facility].    Loss Factors: Decline in physical health  Historical Factors: NA  Risk Reduction Factors:   NA  Continued Clinical Symptoms:  Depression:   Severe  Cognitive Features That Contribute To Risk:  Polarized thinking Thought constriction (tunnel vision)    Suicide Risk:  Minimal: No identifiable suicidal ideation.  Patients presenting with no risk factors but with morbid ruminations; may be classified as minimal risk based on the severity  of the depressive symptoms  Discharge Diagnoses:   AXIS I:  Major Depression Recurrent, Anxiety Disorder NOS AXIS II:  No diagnosis AXIS III:   Past Medical History  Diagnosis Date  . DVT (deep venous thrombosis)   . Pulmonary embolism   . Homelessness   . Depression   . Bipolar 1 disorder    AXIS IV:  other psychosocial or environmental problems AXIS V:  61-70 mild symptoms  Plan Of Care/Follow-up recommendations:  Activity:  as tolerated Diet:  regular Follow up outpatient basis[facility] Is patient on multiple antipsychotic therapies at discharge:  No   Has Patient had three or more failed trials of antipsychotic monotherapy by history:  No  Recommended Plan for Multiple Antipsychotic Therapies: NA    Bryanna Yim A 03/19/2014, 9:52 AM

## 2014-03-19 NOTE — [facility] Group Notes (Signed)
[facility] Note   03/19/2014  10:07 AM   Participation Quality: Alert, Appropriate and Oriented  Mood/Affect: Depressed and Flat  Depression Rating: 0  Anxiety Rating: 0  Thoughts of Suicide: Pt denies SI/HI  Will you contract for safety? Yes  Current AVH: Pt denies  Plan for Discharge/Comments: Pt attended discharge planning group and actively participated in group. CSW provided pt with today's workbook. Patient reports feeling "very good and optimistic" about discharging today. Patient reports no feelings of depression or anxiety. He states that he [facility] to [facility] [place] and is requesting assistance with bus pass. He is [facility]able [facility].  Transportation Means: [place]  Supports: No supports mentioned at this time  Samuella BruinKristin Leeba Hawkins, MSW, [employer]LCSWA Clinical Social Worker [facility] [phone]

## 2014-03-19 NOTE — Progress Notes (Signed)
Pt returned from the ED shortly after 2130 this evening.  Pt was given Lovenox injection and told to return to the radiology dept tomorrow to have a doppler done, because the doppler staff was leaving and it would have to be done in the AM.  Pt denies pain at this time.  He is frustrated that the doppler was not done before [facility].  Writer gave support and encouragement.  Pt also c/o pain to his R lower jaw and tylenol was given.  Pt says he is ready for discharge, but he is not sure where he will go.  [facility]  Pt is pleasant/cooperative with Clinical research associatewriter.  Pt makes his needs known to staff.  Safety maintained with q15 minute checks.

## 2014-03-21 NOTE — ED Provider Notes (Signed)
Medical screening examination/treatment/procedure(s) were performed by non-physician practitioner and as supervising physician I was immediately available for consultation/collaboration.   EKG Interpretation None        Mirian MoMatthew Gentry, MD 03/21/14 1453

## 2014-03-25 NOTE — Progress Notes (Signed)
Patient Discharge Instructions:  After Visit Summary (AVS):   Faxed to:  03/25/14 Discharge Summary Note:   Faxed to:  03/25/14 Psychiatric Admission Assessment Note:   Faxed to:  03/25/14 Suicide Risk Assessment - Discharge Assessment:   Faxed to:  03/25/14 Faxed/Sent to the Next Level Care provider:  03/25/14 [facility] to [facility] @ [phone]  Jerelene ReddenSheena E Anchor, 03/25/2014, 3:46 PM

## 2014-05-01 ENCOUNTER — Encounter: Payer: Self-pay | Admitting: Internal Medicine

## 2015-01-04 ENCOUNTER — Emergency Department (HOSPITAL_COMMUNITY)
Admission: EM | Admit: 2015-01-04 | Discharge: 2015-01-04 | Disposition: A | Discharge status: Home Health | Payer: Self-pay | Attending: Emergency Medicine | Admitting: Emergency Medicine

## 2015-01-04 ENCOUNTER — Encounter (HOSPITAL_COMMUNITY): Payer: Self-pay | Admitting: Emergency Medicine

## 2015-01-04 DIAGNOSIS — Z86718 Personal history of other venous thrombosis and embolism: Secondary | ICD-10-CM | POA: Insufficient documentation

## 2015-01-04 DIAGNOSIS — Z79899 Other long term (current) drug therapy: Secondary | ICD-10-CM | POA: Insufficient documentation

## 2015-01-04 DIAGNOSIS — Z590 Homelessness: Secondary | ICD-10-CM | POA: Insufficient documentation

## 2015-01-04 DIAGNOSIS — L02413 Cutaneous abscess of right upper limb: Secondary | ICD-10-CM | POA: Insufficient documentation

## 2015-01-04 DIAGNOSIS — R110 Nausea: Secondary | ICD-10-CM | POA: Insufficient documentation

## 2015-01-04 DIAGNOSIS — Z7901 Long term (current) use of anticoagulants: Secondary | ICD-10-CM | POA: Insufficient documentation

## 2015-01-04 DIAGNOSIS — F319 Bipolar disorder, unspecified: Secondary | ICD-10-CM | POA: Insufficient documentation

## 2015-01-04 DIAGNOSIS — Z86711 Personal history of pulmonary embolism: Secondary | ICD-10-CM | POA: Insufficient documentation

## 2015-01-04 MED ORDER — LIDOCAINE-EPINEPHRINE (PF) 2 %-1:200000 IJ SOLN
10.0000 mL | Freq: Once | INTRAMUSCULAR | Status: AC
  Administered 2015-01-04: 10 mL
  Filled 2015-01-04: qty 20

## 2015-01-04 NOTE — ED Provider Notes (Signed)
CSN: [id number]     Arrival date & time 01/04/15  1549 History  This chart was scribed for Christy SartoriusErin O'Mally, Jim Hawkins, working with Lorre NickAnthony Allen, MD by Jolene Provostobert Halas, ED Scribe. This patient was seen in room TR07C/TR07C and the patient's care was started at 6:17 PM.    Chief Complaint  Patient presents with  . Abscess   HPI  HPI Comments: Jim Hawkins is a 44 y.o. male who presents to the Emergency Department complaining of an abscess in the right upper arm over triceps that he first noticed two weeks ago. Pt states it swelled dramatically yesterday which is why he reported to the ED. Pain is constant, aching and sore, 10/10 at worst.  Pt states he has had two other abscesses in the last year, one in the same location. Pt states he is allergic to naproxin, which makes his heart race. Pt has no allergies to abx.  Denies fever, n/v/d.   Past Medical History  Diagnosis Date  . DVT (deep venous thrombosis)   . Pulmonary embolism   . Homelessness   . Depression   . Bipolar 1 disorder    Past Surgical History  Procedure Laterality Date  . Green filter     No family history on file. History  Substance Use Topics  . Smoking status: Light Tobacco Smoker -- 0.25 packs/day    Types: Cigarettes  . Smokeless tobacco: Not on file  . Alcohol Use: No    Review of Systems  Constitutional: Negative for fever and chills.  Gastrointestinal: Positive for nausea. Negative for vomiting.  Skin: Positive for color change and wound.      Allergies  Naproxen  Home Medications   Prior to Admission medications   Medication Sig Start Date End Date Taking? Authorizing Provider  citalopram (CELEXA) 20 MG tablet Take 1 tablet (20 mg total) by mouth daily. For depression 03/19/14   Sanjuana KavaAgnes I Nwoko, NP  hydrOXYzine (ATARAX/VISTARIL) 25 MG tablet Take 1 tablet (25 mg total) by mouth 3 (three) times daily as needed for anxiety. 03/19/14   Sanjuana KavaAgnes I Nwoko, NP  traZODone (DESYREL) 50 MG tablet Take 1 tablet (50  mg total) by mouth at bedtime as needed for sleep. 03/19/14   Sanjuana KavaAgnes I Nwoko, NP  warfarin (COUMADIN) 10 MG tablet Take 1 tablet (10 mg total) by mouth daily before supper. (Have your blood drawn on Friday 03-20-14 to check your coumadin level): Blood thinner 03/19/14   Sanjuana KavaAgnes I Nwoko, NP   BP 106/61 mmHg  Pulse 60  Temp(Src) 98.1 F (36.7 C) (Oral)  Resp 22  Ht 6\' 1"  (1.854 m)  Wt 175 lb (79.379 kg)  BMI 23.09 kg/m2  SpO2 100% Physical Exam  Constitutional: He is oriented to person, place, and time. He appears well-developed and well-nourished. No distress.  HENT:  Head: Normocephalic and atraumatic.  Eyes: Pupils are equal, round, and reactive to light.  Neck: Neck supple.  Cardiovascular: Normal rate.   Pulmonary/Chest: Effort normal. No respiratory distress.  Musculoskeletal: Normal range of motion.  Neurological: He is alert and oriented to person, place, and time. Coordination normal.  Skin: Skin is warm and dry. He is not diaphoretic.  1cm area of edema, erythema, and induration with centralized fluctuance. Tender to touch, no active bleeding or discharge. No red streaking.  Psychiatric: He has a normal mood and affect. His behavior is normal.  Nursing note and vitals reviewed.   ED Course  Procedures   INCISION AND DRAINAGE Performed  by: Jim Hawkins A. Consent: Verbal consent obtained. Risks and benefits: risks, benefits and alternatives were discussed Type: abscess  Body area: Right upper arm, triceps region  Anesthesia: local infiltration  Incision was made with a scalpel.  Local anesthetic: lidocaine 2% with epinephrine  Anesthetic total: 0.95ml  Complexity: complex Blunt dissection to break up loculations  Drainage: purulent  Drainage amount: 2cc  Packing material: none  Patient tolerance: Patient tolerated the procedure well with no immediate complications.     DIAGNOSTIC STUDIES: Oxygen Saturation is 100% on RA, normal by my interpretation.     COORDINATION OF CARE: 6:22 PM Discussed treatment plan with pt at bedside and pt agreed to plan.  Labs Review Labs Reviewed - No data to display  Imaging Review No results found.   EKG Interpretation None      MDM   Final diagnoses:  Abscess of right arm    Pt is a 44yo male presenting to ED with c/o recurrent abscesses, c/o sudden worsening of abscess on Right upper arm.  I&D performed successfully in ED. No systemic symptoms. Home care instructions provided. Advised to f/u with PCP in 3 days for wound recheck. Return precautions provided. Pt verbalized understanding and agreement with tx plan.   I personally performed the services described in this documentation, which was scribed in my presence. The recorded information has been reviewed and is accurate.    Jim Finner, Jim Hawkins 01/04/15 1942  Lorre Nick, MD 01/04/15 2300

## 2015-01-04 NOTE — Discharge Instructions (Signed)

## 2015-01-04 NOTE — ED Notes (Signed)
C/o abscess under R arm x 2 weeks.

## 2015-01-04 NOTE — ED Notes (Signed)
Wrapped with sterile drsing and cling.  Pt states it feels much better

## 2015-02-19 ENCOUNTER — Emergency Department (HOSPITAL_COMMUNITY): Payer: Self-pay

## 2015-02-19 ENCOUNTER — Emergency Department (HOSPITAL_COMMUNITY)
Admission: EM | Admit: 2015-02-19 | Discharge: 2015-02-19 | Disposition: A | Discharge status: Home / Self Care | Payer: Self-pay | Attending: Emergency Medicine | Admitting: Emergency Medicine

## 2015-02-19 ENCOUNTER — Encounter (HOSPITAL_COMMUNITY): Payer: Self-pay | Admitting: Neurology

## 2015-02-19 DIAGNOSIS — Z86718 Personal history of other venous thrombosis and embolism: Secondary | ICD-10-CM | POA: Insufficient documentation

## 2015-02-19 DIAGNOSIS — Z7901 Long term (current) use of anticoagulants: Secondary | ICD-10-CM | POA: Insufficient documentation

## 2015-02-19 DIAGNOSIS — R0789 Other chest pain: Secondary | ICD-10-CM | POA: Insufficient documentation

## 2015-02-19 DIAGNOSIS — F319 Bipolar disorder, unspecified: Secondary | ICD-10-CM | POA: Insufficient documentation

## 2015-02-19 DIAGNOSIS — Z720 Tobacco use: Secondary | ICD-10-CM | POA: Insufficient documentation

## 2015-02-19 DIAGNOSIS — E860 Dehydration: Secondary | ICD-10-CM | POA: Insufficient documentation

## 2015-02-19 DIAGNOSIS — M62838 Other muscle spasm: Secondary | ICD-10-CM | POA: Insufficient documentation

## 2015-02-19 DIAGNOSIS — Z590 Homelessness: Secondary | ICD-10-CM | POA: Insufficient documentation

## 2015-02-19 DIAGNOSIS — Z86711 Personal history of pulmonary embolism: Secondary | ICD-10-CM | POA: Insufficient documentation

## 2015-02-19 LAB — BASIC METABOLIC PANEL
Anion gap: 10 (ref 5–15)
BUN: 22 mg/dL — ABNORMAL HIGH (ref 6–20)
CALCIUM: 9.8 mg/dL (ref 8.9–10.3)
CHLORIDE: 106 mmol/L (ref 101–111)
CO2: 23 mmol/L (ref 22–32)
Creatinine, Ser: 1.86 mg/dL — ABNORMAL HIGH (ref 0.61–1.24)
GFR calc Af Amer: 50 mL/min — ABNORMAL LOW (ref >60)
GFR, EST NON AFRICAN AMERICAN: 43 mL/min — AB (ref >60)
Glucose, Bld: 136 mg/dL — ABNORMAL HIGH (ref 65–99)
Potassium: 4 mmol/L (ref 3.5–5.1)
Sodium: 139 mmol/L (ref 135–145)

## 2015-02-19 LAB — CBC
HEMATOCRIT: 40.3 % (ref 39.0–52.0)
Hemoglobin: 13.5 g/dL (ref 13.0–17.0)
MCH: 30.1 pg (ref 26.0–34.0)
MCHC: 33.5 g/dL (ref 30.0–36.0)
MCV: 89.8 fL (ref 78.0–100.0)
Platelets: 178 10*3/uL (ref 150–400)
RBC: 4.49 MIL/uL (ref 4.22–5.81)
RDW: 13.6 % (ref 11.5–15.5)
WBC: 7.4 10*3/uL (ref 4.0–10.5)

## 2015-02-19 LAB — I-STAT TROPONIN, ED: Troponin i, poc: 0 ng/mL (ref 0.00–0.08)

## 2015-02-19 MED ORDER — SODIUM CHLORIDE 0.9 % IV BOLUS (SEPSIS)
1000.0000 mL | Freq: Once | INTRAVENOUS | Status: AC
  Administered 2015-02-19: 1000 mL via INTRAVENOUS

## 2015-02-19 MED ORDER — CYCLOBENZAPRINE HCL 10 MG PO TABS: 10.0000 mg | ORAL_TABLET | Freq: Two times a day (BID) | ORAL | Status: AC | PRN

## 2015-02-19 NOTE — ED Notes (Signed)
Pt reports today he was working and felt lump on side of right arm traveling up to his lung and felt sharp. Then felt right sided cp. Has hx of PEs, is not taking blood thinners in awhile. No SOB, is a x 4. Skin warm and dry.

## 2015-02-19 NOTE — Discharge Instructions (Signed)

## 2015-02-19 NOTE — ED Provider Notes (Addendum)
CSN: [id number]     Arrival date & time 02/19/15  1250 History   First MD Initiated Contact with Patient 02/19/15 1511     Chief Complaint  Patient presents with  . Chest Pain     (Consider location/radiation/quality/duration/timing/severity/associated sxs/prior Treatment) HPI Comments: Patient with a history of DVT who is no longer taking warfarin, depression and bipolar disease who presents today with right arm cramping and pain that moved into the chest. He states that he's been outside all day working in the heat. He was raking when he started to feel a pain in his right forearm. He states the muscle swelled up and the pain traveled from his arm to the shoulder and his chest. The pain felt better when he lifted his arm over his head.  He denies any shortness of breath, cough, nausea, vomiting, diarrhea, abdominal pain. He states that earlier his hands were tingling but that is resolved. While working outside he has not drank any water today  Patient is a 44 y.o. male presenting with chest pain. The history is provided by the patient.  Chest Pain Pain location:  R chest Pain quality: shooting   Pain quality comment:  Cramping Pain radiates to:  R arm Onset quality:  Sudden Timing:  Constant Progression:  Resolved Chronicity:  Recurrent   Past Medical History  Diagnosis Date  . DVT (deep venous thrombosis)   . Pulmonary embolism   . Homelessness   . Depression   . Bipolar 1 disorder    Past Surgical History  Procedure Laterality Date  . Green filter     No family history on file. History  Substance Use Topics  . Smoking status: Light Tobacco Smoker -- 0.25 packs/day    Types: Cigarettes  . Smokeless tobacco: Not on file  . Alcohol Use: No    Review of Systems  Cardiovascular: Positive for chest pain.  All other systems reviewed and are negative.     Allergies  Naproxen  Home Medications   Prior to Admission medications   Medication Sig Start Date End Date  Taking? Authorizing Provider  citalopram (CELEXA) 20 MG tablet Take 1 tablet (20 mg total) by mouth daily. For depression Patient not taking: Reported on 02/19/2015 03/19/14   Sanjuana KavaAgnes I Nwoko, NP  hydrOXYzine (ATARAX/VISTARIL) 25 MG tablet Take 1 tablet (25 mg total) by mouth 3 (three) times daily as needed for anxiety. Patient not taking: Reported on 02/19/2015 03/19/14   Sanjuana KavaAgnes I Nwoko, NP  traZODone (DESYREL) 50 MG tablet Take 1 tablet (50 mg total) by mouth at bedtime as needed for sleep. Patient not taking: Reported on 02/19/2015 03/19/14   Sanjuana KavaAgnes I Nwoko, NP  warfarin (COUMADIN) 10 MG tablet Take 1 tablet (10 mg total) by mouth daily before supper. (Have your blood drawn on Friday 03-20-14 to check your coumadin level): Blood thinner Patient not taking: Reported on 02/19/2015 03/19/14   Sanjuana KavaAgnes I Nwoko, NP   BP 123/74 mmHg  Pulse 66  Temp(Src) 97.9 F (36.6 C) (Oral)  Resp 16  SpO2 100% Physical Exam  Constitutional: He is oriented to person, place, and time. He appears well-developed and well-nourished. No distress.  HENT:  Head: Normocephalic and atraumatic.  Mouth/Throat: Oropharynx is clear and moist.  Eyes: Conjunctivae and EOM are normal. Pupils are equal, round, and reactive to light.  Neck: Normal range of motion. Neck supple.  Cardiovascular: Normal rate, regular rhythm and intact distal pulses.   No murmur heard. Pulmonary/Chest: Effort normal and breath  sounds normal. No respiratory distress. He has no wheezes. He has no rales. He exhibits tenderness.  Minimal right chest wall tenderness  Abdominal: Soft. He exhibits no distension. There is no tenderness. There is no rebound and no guarding.  Musculoskeletal: Normal range of motion. He exhibits no edema or tenderness.  Neurological: He is alert and oriented to person, place, and time.  Skin: Skin is warm and dry. No rash noted. No erythema.  Psychiatric: He has a normal mood and affect. His behavior is normal.  Nursing note and  vitals reviewed.   ED Course  Procedures (including critical care time) Labs Review Labs Reviewed  BASIC METABOLIC PANEL - Abnormal; Notable for the following:    Glucose, Bld 136 (*)    BUN 22 (*)    Creatinine, Ser 1.86 (*)    GFR calc non Af Amer 43 (*)    GFR calc Af Amer 50 (*)    All other components within normal limits  CBC  I-STAT TROPOININ, ED    Imaging Review Dg Chest 2 View  02/19/2015   CLINICAL DATA:  Chest pain  EXAM: CHEST  2 VIEW  COMPARISON:  01/24/2014  FINDINGS: Cardiomediastinal silhouette is stable. No acute infiltrate or pleural effusion. No pulmonary edema. Mild hyperinflation. Bony thorax is stable.  IMPRESSION: No active cardiopulmonary disease.   Electronically Signed   By: Natasha Mead M.D.   On: 02/19/2015 13:54     EKG Interpretation   Date/Time:  Thursday February 19 2015 12:55:43 EDT Ventricular Rate:  94 PR Interval:  150 QRS Duration: 80 QT Interval:  344 QTC Calculation: 430 R Axis:   77 Text Interpretation:  Normal sinus rhythm Right atrial enlargement Minimal  voltage criteria for LVH, may be normal variant No significant change  since last tracing Confirmed by Anitra Lauth  MD, Alphonzo Lemmings ([id number]) on 02/19/2015  3:34:19 PM      MDM   Final diagnoses:  Dehydration  Muscle spasm    Patient presenting today with symptoms that are most consistent with muscle spasm. He was raking outside in the heat and hasn't drank any water today when he started to feel cramping in his left arm which then radiated into his chest. It felt better when he moved his arm a certain way and is now gone. He denied any shortness of breath related to the incident. He denies any unilateral leg pain or swelling. He was diagnosed with a DVT but has been off anticoagulation for over 6 months. Patient's EKG is unchanged in labs show dehydration but otherwise no acute findings. Low suspicion that this is a PE today. Patient has had a normal blood pressure and heart rate with 100%  saturation the entire time he has been here. He was given IV fluids and discharged home.    Gwyneth Sprout, MD 02/19/15 1654  Gwyneth Sprout, MD 02/19/15 1731

## 2015-09-20 IMAGING — CR DG CHEST 2V
2 series · 2 of 2 positions shown · non-contrast
Comparison: None.

CLINICAL DATA: Body aches, pains, cramps

EXAM:
CHEST  2 VIEW

[w chest pa]
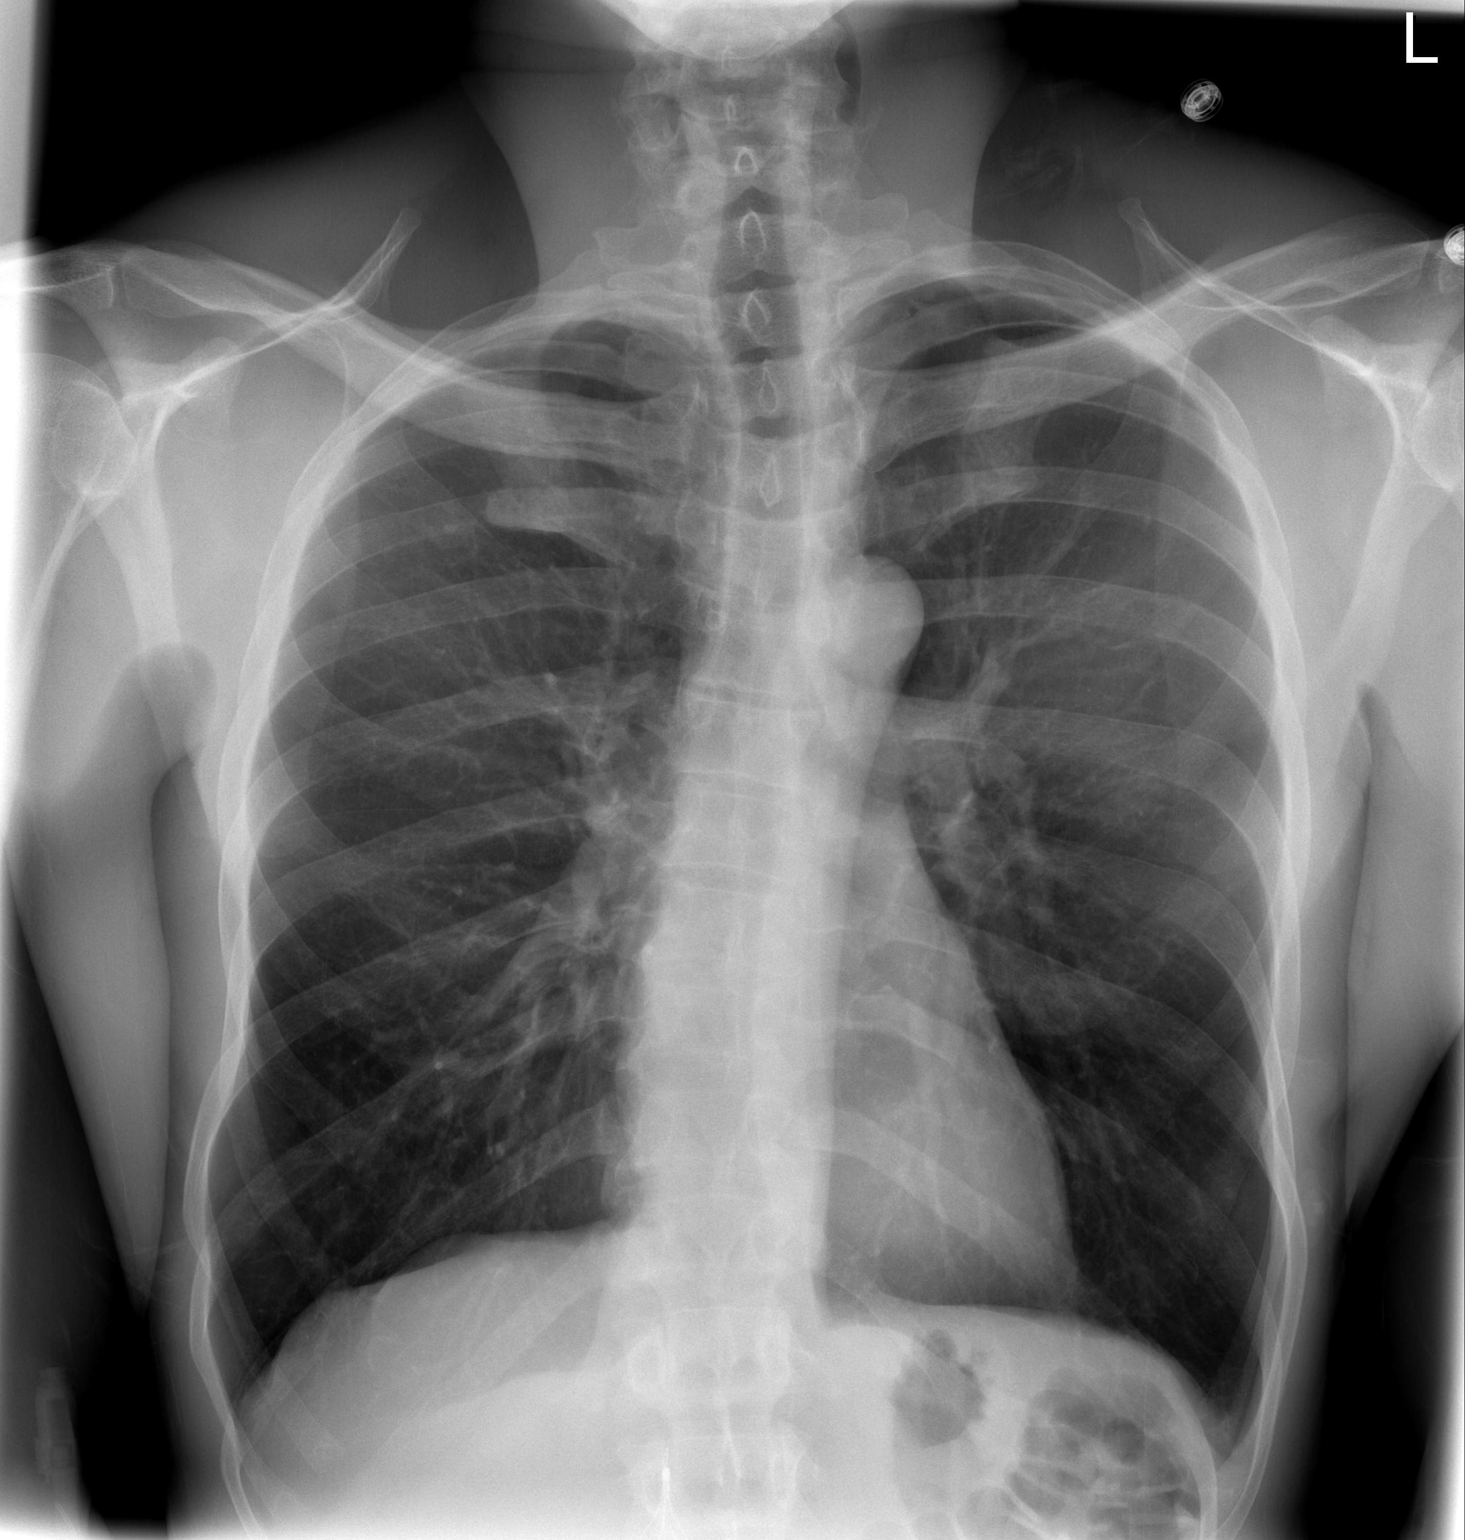

[w chest lat]
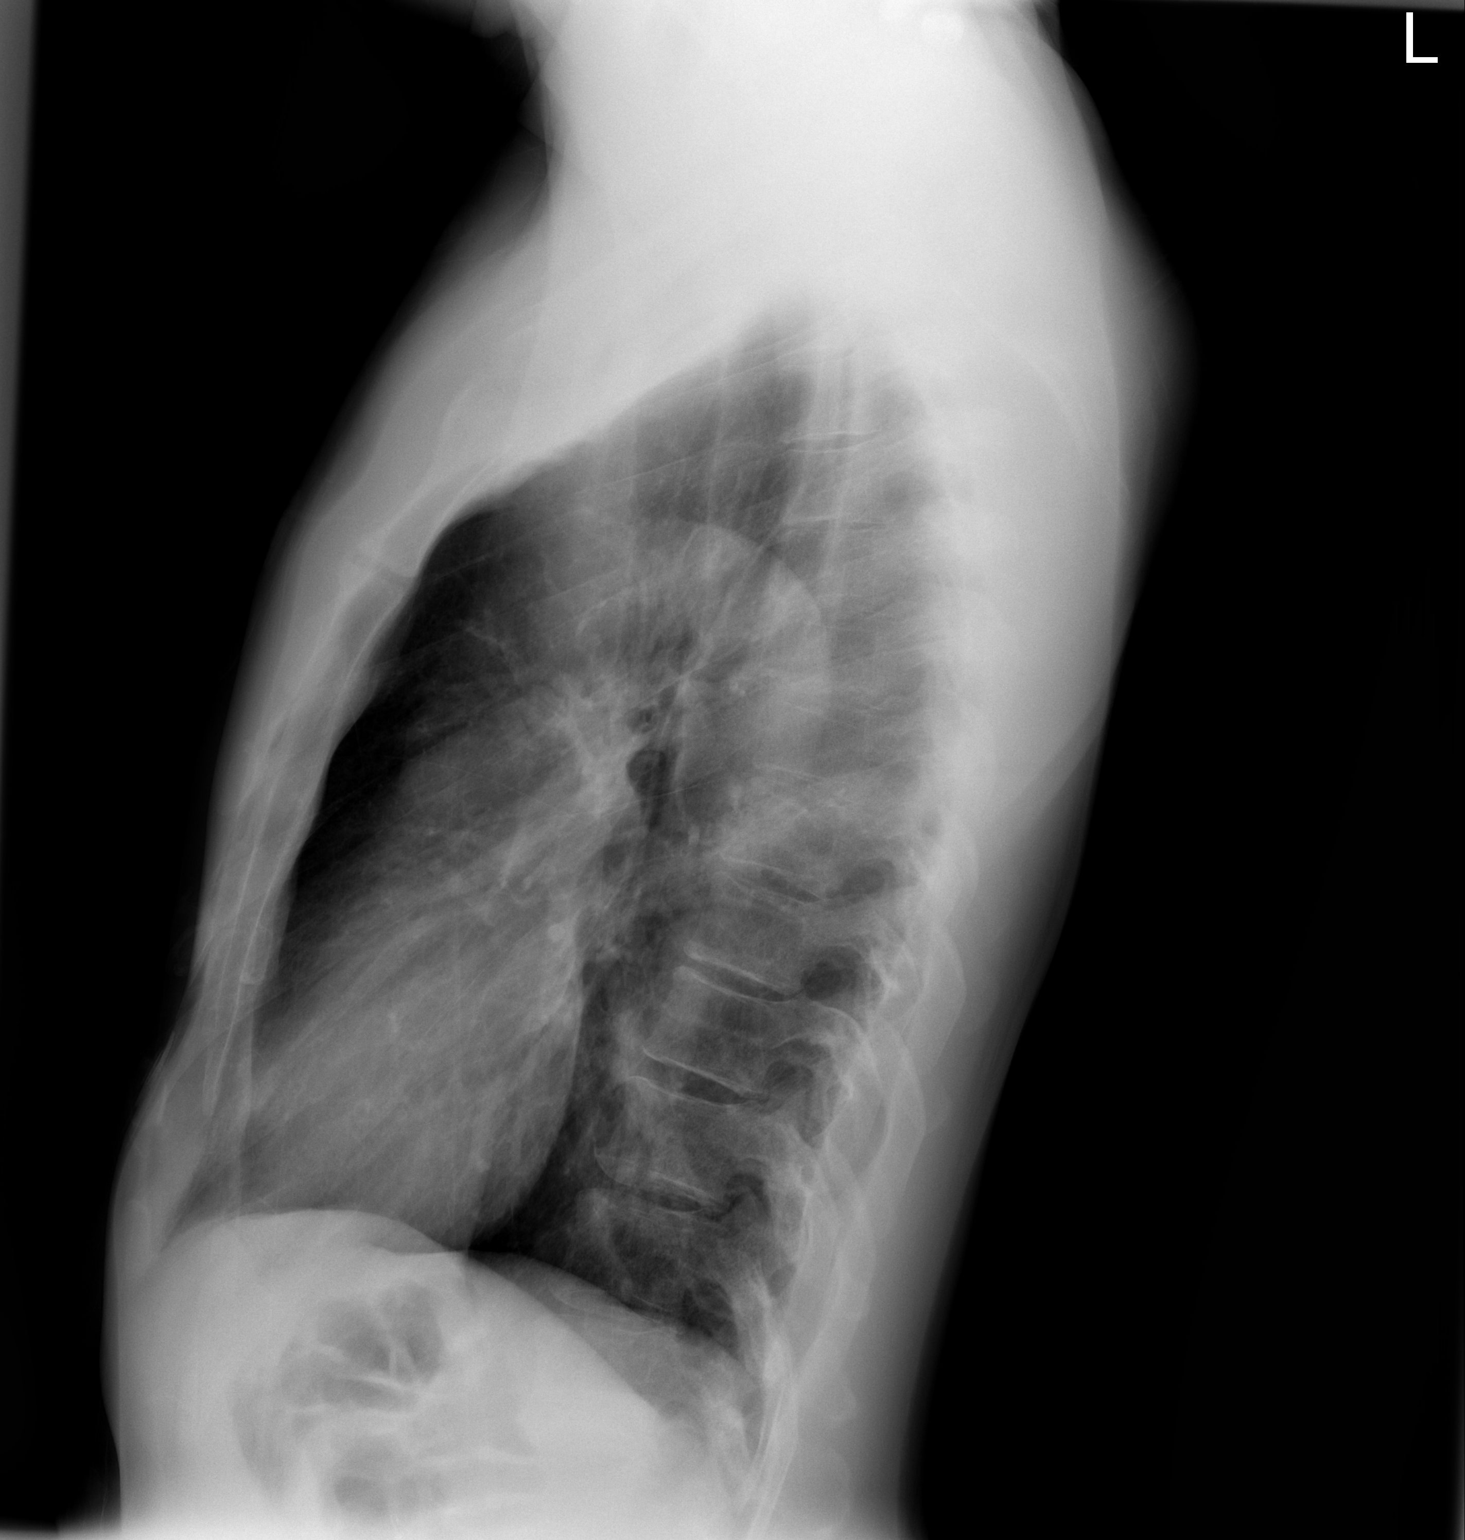

[2 of 2 positions shown; findings below may reference images not displayed]

FINDINGS: The lungs are hyperinflated likely secondary to COPD. There is no
focal parenchymal opacity, pleural effusion, or pneumothorax. The
heart and mediastinal contours are unremarkable.

The osseous structures are unremarkable.
IMPRESSION: No active cardiopulmonary disease.

## 2017-08-17 DIAGNOSIS — J029 Acute pharyngitis, unspecified: Secondary | ICD-10-CM

## 2017-08-17 NOTE — ED Provider Notes (Signed)
The history is provided by the patient.   Sore Throat    This is a recurrent problem. The current episode started more than 1 week ago. The problem has not changed since onset.There has been no fever. Pertinent negatives include no vomiting, no congestion and no cough. He has had no exposure to strep or mono. Treatments tried: recently on clindamycin. The treatment provided significant relief.        Past Medical History:   Diagnosis Date   ??? DVT (deep venous thrombosis) (HCC) 12/13    had EKOS and lysis, IVC    ??? Hypertension        Past Surgical History:   Procedure Laterality Date   ??? HX OTHER SURGICAL      greenfield filter   ??? HX VASCULAR ACCESS      IVC filter for DVT         Family History:   Problem Relation Age of Onset   ??? Hypertension Other        Social History     Socioeconomic History   ??? Marital status: SINGLE     Spouse name: Not on file   ??? Number of children: Not on file   ??? Years of education: Not on file   ??? Highest education level: Not on file   Social Needs   ??? Financial resource strain: Not on file   ??? Food insecurity - worry: Not on file   ??? Food insecurity - inability: Not on file   ??? Transportation needs - medical: Not on file   ??? Transportation needs - non-medical: Not on file   Occupational History   ??? Not on file   Tobacco Use   ??? Smoking status: Former Smoker     Last attempt to quit: 11/07/2011     Years since quitting: 5.7   ??? Smokeless tobacco: Never Used   Substance and Sexual Activity   ??? Alcohol use: No   ??? Drug use: No   ??? Sexual activity: Not on file   Other Topics Concern   ??? Not on file   Social History Narrative   ??? Not on file         ALLERGIES: Naproxen    Review of Systems   Constitutional: Negative for fever.   HENT: Positive for sore throat. Negative for congestion and rhinorrhea.    Respiratory: Negative for cough.    Gastrointestinal: Negative for abdominal pain and vomiting.   Musculoskeletal: Negative for myalgias.       Vitals:    08/17/17 1939   BP: 125/74    Pulse: 79   Resp: 18   Temp: 98.1 ??F (36.7 ??C)   SpO2: 96%   Weight: 79.4 kg (175 lb)   Height: 6' (1.829 m)            Physical Exam   Constitutional: He appears well-developed and well-nourished.   HENT:   Mouth/Throat: Mucous membranes are normal. Oropharyngeal exudate and posterior oropharyngeal erythema present.   Neck: Normal range of motion.   Cardiovascular: Normal rate, regular rhythm and normal heart sounds.   Pulmonary/Chest: Effort normal and breath sounds normal.   Abdominal: Soft. There is no tenderness.   Lymphadenopathy:     He has cervical adenopathy.   Skin: Skin is warm and dry.   Nursing note and vitals reviewed.       MDM  Number of Diagnoses or Management Options  Diagnosis management comments: Current pharyngitis or tonsillitis.  Monotest negative.  Strep negative. symptomatic care.       Amount and/or Complexity of Data Reviewed  Clinical lab tests: ordered and reviewed (Results for orders placed or performed during the hospital encounter of 08/17/17  -STREP AG SCREEN, GROUP A       Result                      Value             Ref Range           Group A Strep Ag ID         NEGATIVE          NEG            -MONONUCLEOSIS SCREEN       Result                      Value             Ref Range           Mononucleosis screen        NEGATIVE          NEG            )           Procedures

## 2017-08-17 NOTE — Progress Notes (Signed)
Not placed on antibiotics.

## 2017-08-17 NOTE — ED Triage Notes (Signed)
Pt complains of painful swallowing x 4 days. Denies cough runny nose. [place] but has not completed clindamycin prescription.

## 2017-08-17 NOTE — ED Notes (Signed)

## 2017-08-18 ENCOUNTER — Inpatient Hospital Stay
Admit: 2017-08-18 | Discharge: 2017-08-18 | Disposition: A | Discharge status: Home / Self Care | Payer: Self-pay | Attending: Emergency Medicine

## 2017-08-18 LAB — STREP AG SCREEN, GROUP A: Group A Strep Ag ID: NEGATIVE

## 2017-08-18 LAB — MONONUCLEOSIS SCREEN: Mononucleosis screen: NEGATIVE

## 2017-08-20 LAB — CULTURE, STREP THROAT: Culture result:: NEGATIVE
